# Patient Record
Sex: Female | Born: 1970 | Race: Black or African American | Hispanic: No | Marital: Married | Smoking: Former smoker
Health system: Southern US, Community
[De-identification: ages and names within clinical notes are randomized; demographics above are authoritative.]

## PROBLEM LIST (undated history)

## (undated) DIAGNOSIS — M549 Dorsalgia, unspecified: Secondary | ICD-10-CM

## (undated) DIAGNOSIS — G8929 Other chronic pain: Secondary | ICD-10-CM

## (undated) HISTORY — PX: TOTAL ABDOMINAL HYSTERECTOMY W/ BILATERAL SALPINGOOPHORECTOMY: SHX83

## (undated) HISTORY — PX: ABDOMINAL HYSTERECTOMY: SHX81

---

## 2005-12-08 ENCOUNTER — Emergency Department: Payer: Self-pay | Admitting: Emergency Medicine

## 2006-04-02 ENCOUNTER — Inpatient Hospital Stay: Payer: Self-pay | Admitting: Obstetrics and Gynecology

## 2006-06-20 ENCOUNTER — Ambulatory Visit: Payer: Self-pay | Admitting: Obstetrics and Gynecology

## 2012-03-07 ENCOUNTER — Emergency Department: Payer: Self-pay | Admitting: Emergency Medicine

## 2012-11-21 ENCOUNTER — Ambulatory Visit: Payer: Self-pay | Admitting: Family Medicine

## 2014-04-25 IMAGING — CR CERVICAL SPINE - COMPLETE 4+ VIEW
1 series · 6 of 6 positions shown · non-contrast
Comparison: none

REASON FOR EXAM: neck tension
COMMENTS:

[Series 1: lat · 0.17mm/px · 6 of 6 slices shown]
[im 1/6]
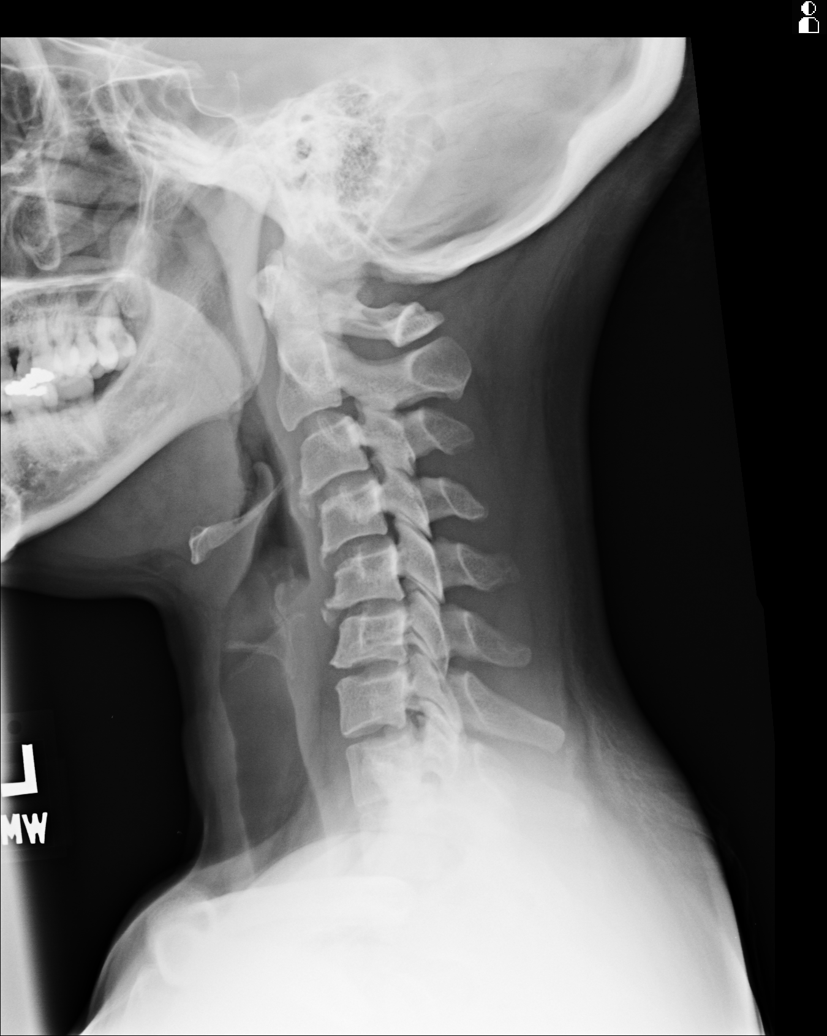
[im 2/6]
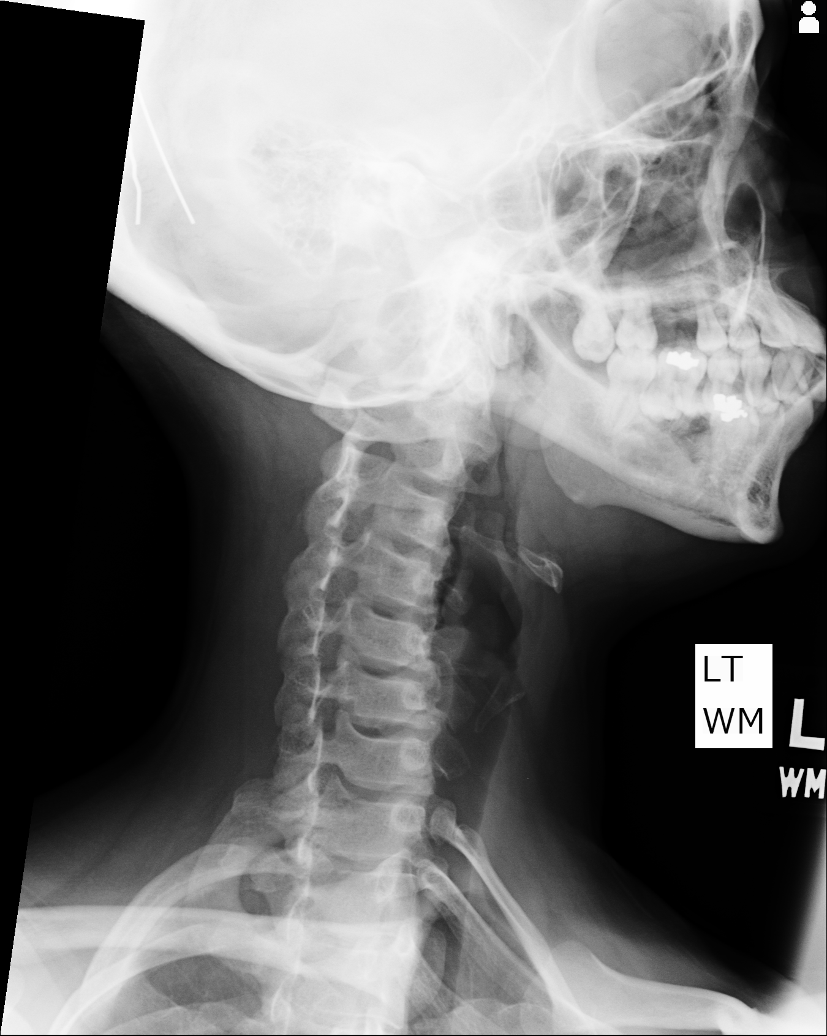
[im 3/6]
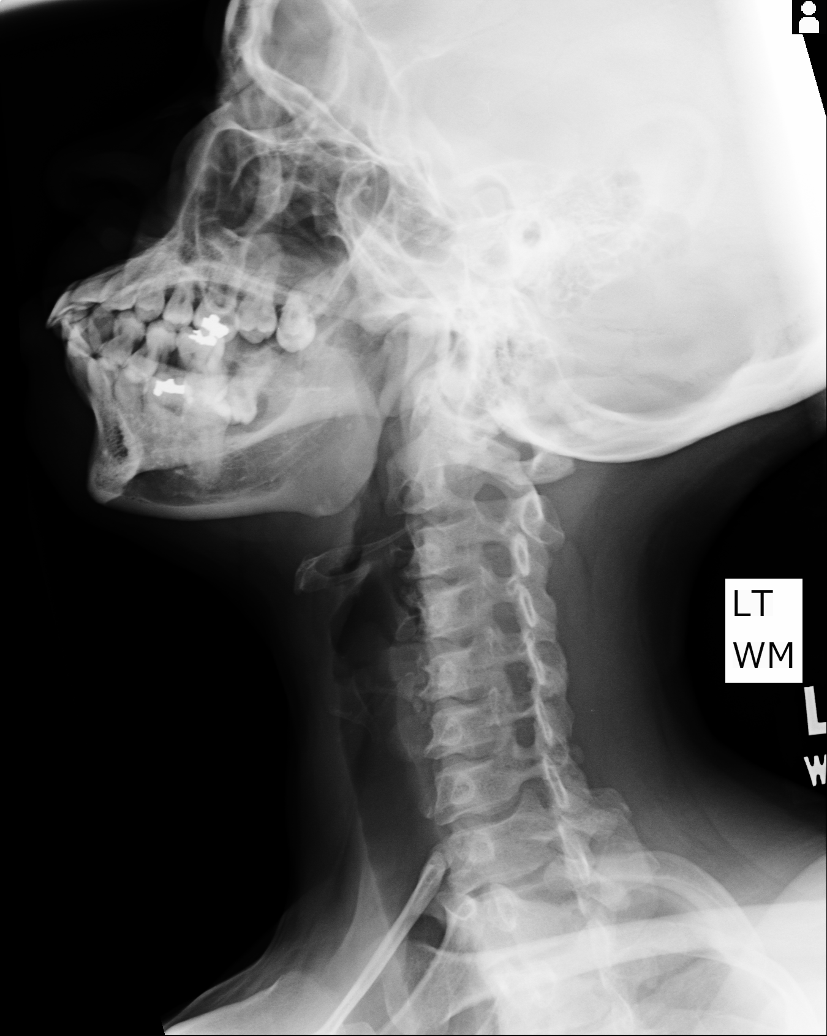
[im 4/6]
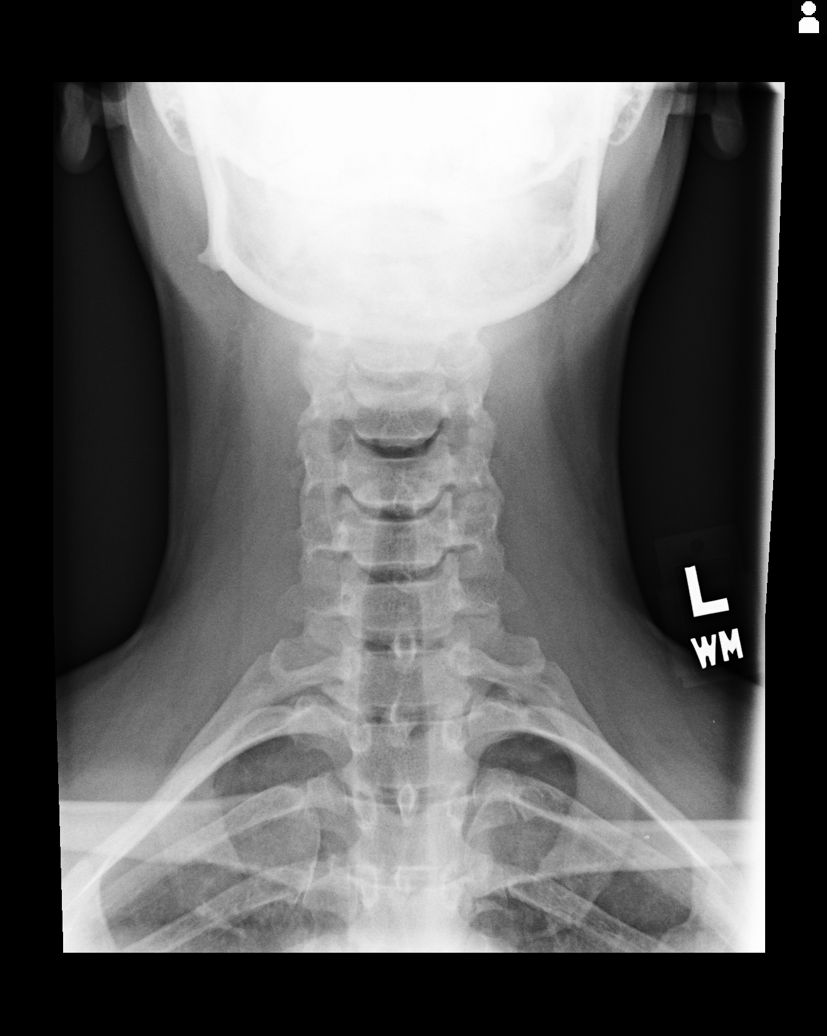
[im 5/6]
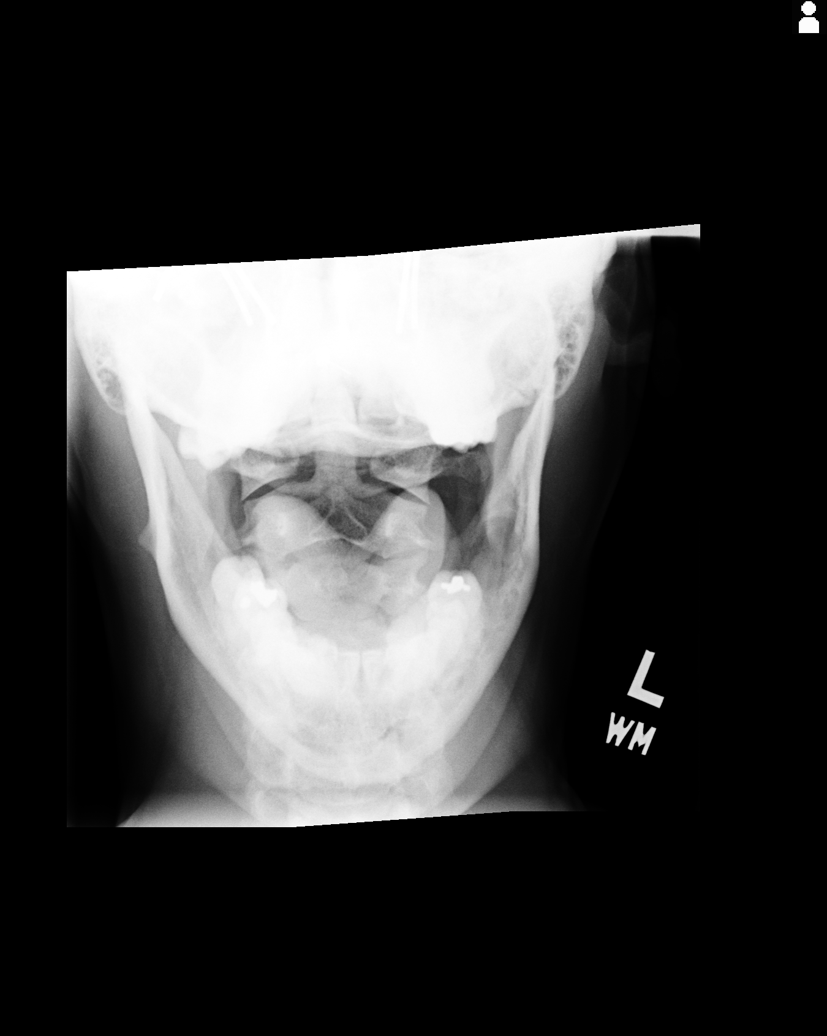
[im 6/6]
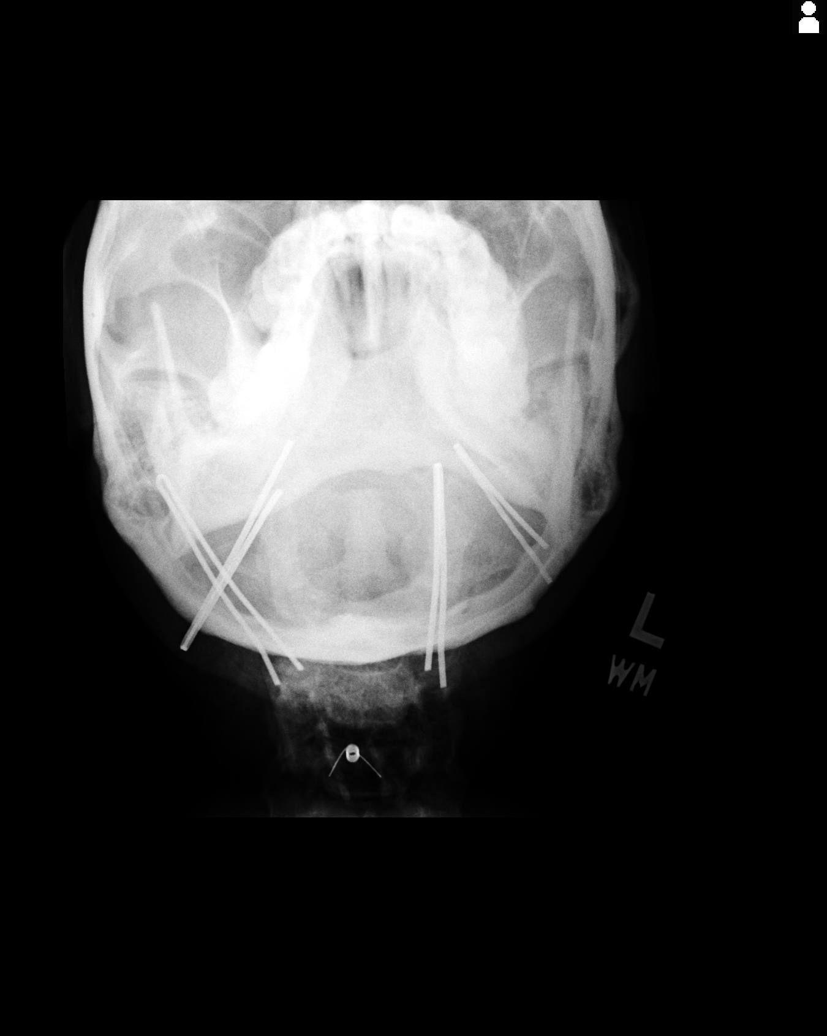

[6 of 6 positions shown; findings below may reference images not displayed]

PROCEDURE:     ABDENAIM - SAVIO LOCKLEAR COMPLETE  - November 21, 2012  [DATE]

RESULT:     There is reversal of the normal cervical lordosis centered at
the C4-C5 level. The prevertebral soft tissues are normal. There is no bony
encroachment on the foramina. The alignment is otherwise unremarkable. The
odontoid appears intact. The atlantoaxial alignment is normal.
IMPRESSION: Reversal of the normal cervical lordosis which may be
secondary to muscle spasm. Correlate clinically. No acute bony abnormality
otherwise.

[REDACTED]

## 2015-11-11 ENCOUNTER — Emergency Department
Admission: EM | Admit: 2015-11-11 | Discharge: 2015-11-11 | Disposition: A | Discharge status: Home / Self Care | Payer: No Typology Code available for payment source | Attending: Emergency Medicine | Admitting: Emergency Medicine

## 2015-11-11 ENCOUNTER — Encounter: Payer: Self-pay | Admitting: Emergency Medicine

## 2015-11-11 DIAGNOSIS — M62830 Muscle spasm of back: Secondary | ICD-10-CM | POA: Diagnosis not present

## 2015-11-11 DIAGNOSIS — S0990XA Unspecified injury of head, initial encounter: Secondary | ICD-10-CM | POA: Insufficient documentation

## 2015-11-11 DIAGNOSIS — Y9389 Activity, other specified: Secondary | ICD-10-CM | POA: Diagnosis not present

## 2015-11-11 DIAGNOSIS — Y998 Other external cause status: Secondary | ICD-10-CM | POA: Insufficient documentation

## 2015-11-11 DIAGNOSIS — M542 Cervicalgia: Secondary | ICD-10-CM

## 2015-11-11 DIAGNOSIS — Z791 Long term (current) use of non-steroidal anti-inflammatories (NSAID): Secondary | ICD-10-CM | POA: Insufficient documentation

## 2015-11-11 DIAGNOSIS — S3992XA Unspecified injury of lower back, initial encounter: Secondary | ICD-10-CM | POA: Insufficient documentation

## 2015-11-11 DIAGNOSIS — S199XXA Unspecified injury of neck, initial encounter: Secondary | ICD-10-CM | POA: Insufficient documentation

## 2015-11-11 DIAGNOSIS — M62838 Other muscle spasm: Secondary | ICD-10-CM

## 2015-11-11 DIAGNOSIS — Y92410 Unspecified street and highway as the place of occurrence of the external cause: Secondary | ICD-10-CM | POA: Diagnosis not present

## 2015-11-11 HISTORY — DX: Other chronic pain: G89.29

## 2015-11-11 HISTORY — DX: Dorsalgia, unspecified: M54.9

## 2015-11-11 MED ORDER — CYCLOBENZAPRINE HCL 10 MG PO TABS: 10.0000 mg | ORAL_TABLET | Freq: Three times a day (TID) | ORAL | Status: DC | PRN

## 2015-11-11 MED ORDER — MELOXICAM 15 MG PO TABS: 15.0000 mg | ORAL_TABLET | Freq: Every day | ORAL | Status: DC
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
## 2016-07-16 ENCOUNTER — Ambulatory Visit (INDEPENDENT_AMBULATORY_CARE_PROVIDER_SITE_OTHER): Payer: PRIVATE HEALTH INSURANCE | Admitting: Family Medicine

## 2016-07-16 ENCOUNTER — Encounter: Payer: Self-pay | Admitting: Family Medicine

## 2016-07-16 VITALS — BP 121/86 | HR 84 | Temp 98.6°F | Ht 67.1 in | Wt 197.4 lb

## 2016-07-16 DIAGNOSIS — Z113 Encounter for screening for infections with a predominantly sexual mode of transmission: Secondary | ICD-10-CM | POA: Diagnosis not present

## 2016-07-16 DIAGNOSIS — J0100 Acute maxillary sinusitis, unspecified: Secondary | ICD-10-CM | POA: Diagnosis not present

## 2016-07-16 MED ORDER — AMOXICILLIN-POT CLAVULANATE 875-125 MG PO TABS: 1.0000 | ORAL_TABLET | Freq: Two times a day (BID) | ORAL | 0 refills | Status: DC

## 2016-07-16 NOTE — Progress Notes (Signed)
BP 121/86 (BP Location: Right Arm, Patient Position: Sitting, Cuff Size: Large)   Pulse 84   Temp 98.6 F (37 C)   Ht 5' 7.1" (1.704 m)   Wt 197 lb 6.4 oz (89.5 kg)   SpO2 100%   BMI 30.83 kg/m    Subjective:    Patient ID: Renee Parsons, female    DOB: 08/10/1971, 45 y.o.   MRN: [id number]  HPI: Renee Parsons is a 45 y.o. female  Chief Complaint  Patient presents with  . Headache   Headaches Duration: 2 weeks Onset: sudden Severity: severe Quality: sharp Frequency: 2-3x a day Location: behind her eyes Headache duration: 4 hours Radiation: no Time of day headache occurs: at random Alleviating factors: aleve- for a couple of hours Aggravating factors: lights and change in the weather Headache status at time of visit: current headache Treatments attempted: Treatments attempted: claritin, sudafed, rest and aleve", excedrine   Aura: no Nausea:  no Vomiting: no Photophobia:  yes Phonophobia:  no Effect on social functioning:  no Confusion:  no Gait disturbance/ataxia:  no Behavioral changes:  no Fevers:  no   UPPER RESPIRATORY TRACT INFECTION Worst symptom: headache Fever: no Cough: no Shortness of breath: no Wheezing: no Chest pain: no Chest tightness: no Chest congestion: no Nasal congestion: yes Runny nose: yes Post nasal drip: no Sneezing: yes Sore throat: no Swollen glands: no Sinus pressure: yes Headache: yes Face pain: yes Toothache: no Ear pain: no  Ear pressure: no  Eyes red/itching:no Eye drainage/crusting: no  Vomiting: no Rash: no Fatigue: yes Sick contacts: no Strep contacts: no  Context: worse Recurrent sinusitis: no Relief with OTC cold/cough medications: no  Treatments attempted: cold/sinus, mucinex, anti-histamine, pseudoephedrine and cough syrup  Relevant past medical, surgical, family and social history reviewed and updated as indicated. Interim medical history since our last visit reviewed. Allergies and medications  reviewed and updated.  Review of Systems  Constitutional: Positive for fatigue. Negative for activity change, appetite change, chills, diaphoresis, fever and unexpected weight change.  HENT: Positive for congestion, rhinorrhea, sinus pain, sinus pressure and sneezing. Negative for dental problem, drooling, ear discharge, ear pain, facial swelling, hearing loss, mouth sores, nosebleeds, postnasal drip, sore throat, tinnitus, trouble swallowing and voice change.   Respiratory: Negative.   Cardiovascular: Negative.   Psychiatric/Behavioral: Negative.     Per HPI unless specifically indicated above     Objective:    BP 121/86 (BP Location: Right Arm, Patient Position: Sitting, Cuff Size: Large)   Pulse 84   Temp 98.6 F (37 C)   Ht 5' 7.1" (1.704 m)   Wt 197 lb 6.4 oz (89.5 kg)   SpO2 100%   BMI 30.83 kg/m   Wt Readings from Last 3 Encounters:  07/16/16 197 lb 6.4 oz (89.5 kg)  11/11/15 46 lb 5 oz (21 kg)    Physical Exam  Constitutional: She is oriented to person, place, and time. She appears well-developed and well-nourished. No distress.  HENT:  Head: Normocephalic and atraumatic.  Right Ear: Hearing, tympanic membrane, external ear and ear canal normal.  Left Ear: Hearing, tympanic membrane, external ear and ear canal normal.  Nose: Mucosal edema and rhinorrhea present. Right sinus exhibits maxillary sinus tenderness. Left sinus exhibits maxillary sinus tenderness.  Mouth/Throat: Uvula is midline, oropharynx is clear and moist and mucous membranes are normal. No oropharyngeal exudate.  Eyes: Conjunctivae, EOM and lids are normal. Pupils are equal, round, and reactive to light. Right eye exhibits no  discharge. Left eye exhibits no discharge. No scleral icterus.  Neck: Normal range of motion. Neck supple. No JVD present. No tracheal deviation present. No thyromegaly present.  Cardiovascular: Normal rate, regular rhythm, normal heart sounds and intact distal pulses.  Exam reveals  no gallop and no friction rub.   No murmur heard. Pulmonary/Chest: Effort normal and breath sounds normal. No stridor. No respiratory distress. She has no wheezes. She has no rales. She exhibits no tenderness.  Musculoskeletal: Normal range of motion.  Lymphadenopathy:    She has cervical adenopathy.  Neurological: She is alert and oriented to person, place, and time.  Skin: Skin is warm, dry and intact. No rash noted. She is not diaphoretic. No erythema. No pallor.  Psychiatric: She has a normal mood and affect. Her speech is normal and behavior is normal. Judgment and thought content normal. Cognition and memory are normal.  Nursing note and vitals reviewed.   No results found for this or any previous visit.    Assessment & Plan:   Problem List Items Addressed This Visit    None    Visit Diagnoses    Acute non-recurrent maxillary sinusitis    -  Primary   Possibly sinusitis, possibly migraine. Given degree of congestion, will treat with augmentin. Call if not getting better or getting worse.    Relevant Medications   loratadine (CLARITIN) 10 MG tablet   amoxicillin-clavulanate (AUGMENTIN) 875-125 MG tablet   Screening for STD (sexually transmitted disease)       Labs drawn today. Await results.    Relevant Orders   GC/Chlamydia Probe Amp   Hepatitis C antibody   HSV(herpes simplex vrs) 1+2 ab-IgG   RPR   HIV antibody       Follow up plan: Return if symptoms worsen or fail to improve.

## 2016-07-17 LAB — HIV ANTIBODY (ROUTINE TESTING W REFLEX): HIV Screen 4th Generation wRfx: NONREACTIVE

## 2016-07-17 LAB — RPR: RPR Ser Ql: NONREACTIVE

## 2016-07-17 LAB — HSV(HERPES SIMPLEX VRS) I + II AB-IGG
HSV 1 Glycoprotein G Ab, IgG: <0.91 index (ref 0.00–0.90)
HSV 2 GLYCOPROTEIN G AB, IGG: 5.16 {index} — AB (ref 0.00–0.90)

## 2016-07-17 LAB — HEPATITIS C ANTIBODY: Hep C Virus Ab: <0.1 s/co ratio (ref 0.0–0.9)

## 2016-07-20 ENCOUNTER — Telehealth: Payer: Self-pay | Admitting: Family Medicine

## 2016-07-20 LAB — GC/CHLAMYDIA PROBE AMP
Chlamydia trachomatis, NAA: NEGATIVE
NEISSERIA GONORRHOEAE BY PCR: NEGATIVE

## 2016-07-20 NOTE — Telephone Encounter (Signed)
Patient notified

## 2016-07-20 NOTE — Telephone Encounter (Signed)
Please let patient know that her labs came back negative, except she has been exposed to genital herpes. This does not mean she will ever have an outbreak, but if she does, let me know and I'll send her in medicine. Thanks!

## 2017-09-02 ENCOUNTER — Ambulatory Visit: Payer: PRIVATE HEALTH INSURANCE | Admitting: Family Medicine

## 2017-09-02 ENCOUNTER — Encounter: Payer: Self-pay | Admitting: Family Medicine

## 2017-09-02 VITALS — BP 126/83 | HR 91 | Temp 98.2°F | Wt 199.5 lb

## 2017-09-02 DIAGNOSIS — J0120 Acute ethmoidal sinusitis, unspecified: Secondary | ICD-10-CM | POA: Diagnosis not present

## 2017-09-02 MED ORDER — AMOXICILLIN-POT CLAVULANATE 875-125 MG PO TABS: 1.0000 | ORAL_TABLET | Freq: Two times a day (BID) | ORAL | 0 refills | Status: DC

## 2017-09-02 NOTE — Progress Notes (Signed)
BP 126/83 (BP Location: Right Arm, Patient Position: Sitting, Cuff Size: Normal)   Pulse 91   Temp 98.2 F (36.8 C) (Oral)   Wt 199 lb 8 oz (90.5 kg)   SpO2 99%   BMI 31.15 kg/m    Subjective:    Patient ID: Renee Parsons, female    DOB: [birth year], 47 y.o.   MRN: [id number]  HPI: Renee Parsons is a 47 y.o. female  Chief Complaint  Patient presents with  . Sinusitis    x's 6 days. Sinus Pressure. Bloody/Green in color. No other complaints.   About a week of facial pain and pressure, bloody green discharge, generalized malaise, HAs, cough. Denies fever, chills, CP, SOB. Trying flonase, sudafed, claritin with no relief. Does have a history of sinusitis 1-2 times annually.   Relevant past medical, surgical, family and social history reviewed and updated as indicated. Interim medical history since our last visit reviewed. Allergies and medications reviewed and updated.  Review of Systems  Constitutional: Positive for fatigue.  HENT: Positive for congestion, sinus pressure and sinus pain.   Respiratory: Positive for cough.   Cardiovascular: Negative.   Gastrointestinal: Negative.   Genitourinary: Negative.   Musculoskeletal: Negative.   Neurological: Positive for headaches.  Psychiatric/Behavioral: Negative.    Per HPI unless specifically indicated above     Objective:    BP 126/83 (BP Location: Right Arm, Patient Position: Sitting, Cuff Size: Normal)   Pulse 91   Temp 98.2 F (36.8 C) (Oral)   Wt 199 lb 8 oz (90.5 kg)   SpO2 99%   BMI 31.15 kg/m   Wt Readings from Last 3 Encounters:  09/02/17 199 lb 8 oz (90.5 kg)  07/16/16 197 lb 6.4 oz (89.5 kg)  11/11/15 46 lb 5 oz (21 kg)    Physical Exam  Constitutional: She is oriented to person, place, and time. She appears well-developed and well-nourished. No distress.  HENT:  Head: Atraumatic.  Ethmoidal sinuses ttp b/l Right TM injected Oropharynx and nasal mucosa erythematous  Eyes: Conjunctivae are normal.  Pupils are equal, round, and reactive to light. No scleral icterus.  Cardiovascular: Normal rate and normal heart sounds.  Pulmonary/Chest: Effort normal and breath sounds normal. No respiratory distress.  Musculoskeletal: Normal range of motion.  Neurological: She is alert and oriented to person, place, and time.  Skin: Skin is warm and dry.  Psychiatric: She has a normal mood and affect. Her behavior is normal.  Nursing note and vitals reviewed.  Results for orders placed or performed in visit on 07/16/16  GC/Chlamydia Probe Amp  Result Value Ref Range   Chlamydia trachomatis, NAA Negative Negative   Neisseria gonorrhoeae by PCR Negative Negative  Hepatitis C antibody  Result Value Ref Range   Hep C Virus Ab <0.1 0.0 - 0.9 s/co ratio  HSV(herpes simplex vrs) 1+2 ab-IgG  Result Value Ref Range   HSV 1 Glycoprotein G Ab, IgG <0.91 0.00 - 0.90 index   HSV 2 Glycoprotein G Ab, IgG 5.16 (H) 0.00 - 0.90 index  RPR  Result Value Ref Range   RPR Ser Ql Non Reactive Non Reactive  HIV antibody  Result Value Ref Range   HIV Screen 4th Generation wRfx Non Reactive Non Reactive      Assessment & Plan:   Problem List Items Addressed This Visit    None    Visit Diagnoses    Acute ethmoidal sinusitis, recurrence not specified    -  Primary  Will tx with augmentin. Reviewed mucinex, sinus rinses, humidifier. F/u if no improvement   Relevant Medications   amoxicillin-clavulanate (AUGMENTIN) 875-125 MG tablet       Follow up plan: Return if symptoms worsen or fail to improve.

## 2017-09-05 NOTE — Patient Instructions (Signed)
Follow up as needed

## 2017-09-07 ENCOUNTER — Ambulatory Visit: Payer: Self-pay

## 2017-09-07 MED ORDER — PREDNISONE 10 MG PO TABS
ORAL_TABLET | ORAL | 0 refills | Status: DC
Start: 2017-09-07 — End: 2018-01-24

## 2017-09-07 NOTE — Telephone Encounter (Signed)
  Reason for Disposition . Earache  (Exceptions: brief ear pain of < 60 minutes duration, earache occurring during air travel  Answer Assessment - Initial Assessment Questions 1. LOCATION: "Which ear is involved?"     Right ear 2. ONSET: "When did the ear start hurting"      Started Tuesday 3. SEVERITY: "How bad is the pain?"  (Scale 1-10; mild, moderate or severe)   - MILD (1-3): doesn't interfere with normal activities    - MODERATE (4-7): interferes with normal activities or awakens from sleep    - SEVERE (8-10): excruciating pain, unable to do any normal activities      7 4. URI SYMPTOMS: " Do you have a runny nose or cough?"     Slight runny nose 5. FEVER: "Do you have a fever?" If so, ask: "What is your temperature, how was it measured, and when did it start?"     No 6. CAUSE: "Have you been swimming recently?", "How often do you use Q-TIPS?", "Have you had any recent air travel or scuba diving?"     No 7. OTHER SYMPTOMS: "Do you have any other symptoms?" (e.g., headache, stiff neck, dizziness, vomiting, runny nose, decreased hearing)     Right eye is swollen and eye socket hurts 8. PREGNANCY: "Is there any chance you are pregnant?" "When was your last menstrual period?"     No  Protocols used: EARACHE-A-AH

## 2017-09-07 NOTE — Telephone Encounter (Signed)
Routing to provider  

## 2017-09-07 NOTE — Telephone Encounter (Signed)
I will send over some prednisone to help ease her sxs, she should continue full course of abx as directed and can do flonase twice daily and sinus rinses

## 2017-09-07 NOTE — Telephone Encounter (Signed)
Patient notified

## 2017-09-16 ENCOUNTER — Ambulatory Visit: Payer: PRIVATE HEALTH INSURANCE | Admitting: Unknown Physician Specialty

## 2017-09-16 ENCOUNTER — Encounter: Payer: Self-pay | Admitting: Unknown Physician Specialty

## 2017-09-16 DIAGNOSIS — S0300XA Dislocation of jaw, unspecified side, initial encounter: Secondary | ICD-10-CM | POA: Insufficient documentation

## 2017-09-16 MED ORDER — NAPROXEN 500 MG PO TABS: 500.0000 mg | ORAL_TABLET | Freq: Two times a day (BID) | ORAL | 0 refills | Status: DC

## 2017-09-16 NOTE — Progress Notes (Signed)
BP 118/88   Pulse 79   Temp 98.1 F (36.7 C) (Oral)   Wt 198 lb 9.6 oz (90.1 kg)   SpO2 99%   BMI 31.01 kg/m    Subjective:    Patient ID: Renee Parsons Lurz, female    DOB: 05/20/1971, 47 y.o.   MRN: [id number]  HPI: Renee Parsons Goldwasser is a 47 y.o. female  Chief Complaint  Patient presents with  . Ear Pain    pt states her right ear has had a stabbing pain since 09/02/17   Otalgia   There is pain in the right ear. Chronicity: persistent from the 10th following a sinus infection in which the ear looked irritated. Episode frequency: comes and goes, worse in the AM with stabbing pain followed by headache. The problem has been unchanged. There has been no fever. Associated symptoms include headaches. Pertinent negatives include no abdominal pain, coughing, diarrhea, ear discharge, hearing loss, neck pain, rash, rhinorrhea, sore throat or vomiting. She has tried NSAIDs for the symptoms. The treatment provided mild (Relief for just a few hours) relief. There is no history of a chronic ear infection, hearing loss or a tympanostomy tube.    Relevant past medical, surgical, family and social history reviewed and updated as indicated. Interim medical history since our last visit reviewed. Allergies and medications reviewed and updated.  Review of Systems  HENT: Positive for ear pain. Negative for ear discharge, hearing loss, rhinorrhea and sore throat.   Respiratory: Negative for cough.   Gastrointestinal: Negative for abdominal pain, diarrhea and vomiting.  Musculoskeletal: Negative for neck pain.  Skin: Negative for rash.  Neurological: Positive for headaches.    Per HPI unless specifically indicated above     Objective:    BP 118/88   Pulse 79   Temp 98.1 F (36.7 C) (Oral)   Wt 198 lb 9.6 oz (90.1 kg)   SpO2 99%   BMI 31.01 kg/m   Wt Readings from Last 3 Encounters:  09/16/17 198 lb 9.6 oz (90.1 kg)  09/02/17 199 lb 8 oz (90.5 kg)  07/16/16 197 lb 6.4 oz (89.5 kg)      Physical Exam  Constitutional: She is oriented to person, place, and time. She appears well-developed and well-nourished. No distress.  HENT:  Head: Normocephalic and atraumatic.  Right Ear: External ear normal.  Left Ear: External ear normal.  Right TMJ TTP.  Has a mouthguard she is not wearing  Eyes: Conjunctivae and lids are normal. Right eye exhibits no discharge. Left eye exhibits no discharge. No scleral icterus.  Neck: Normal range of motion. Neck supple. No JVD present. Carotid bruit is not present.  Pulmonary/Chest: Effort normal.  Abdominal: Normal appearance. There is no splenomegaly or hepatomegaly.  Musculoskeletal: Normal range of motion.  Neurological: She is alert and oriented to person, place, and time.  Skin: Skin is warm, dry and intact. No rash noted. No pallor.  Psychiatric: She has a normal mood and affect. Her behavior is normal. Judgment and thought content normal.    Results for orders placed or performed in visit on 07/16/16  GC/Chlamydia Probe Amp  Result Value Ref Range   Chlamydia trachomatis, NAA Negative Negative   Neisseria gonorrhoeae by PCR Negative Negative  Hepatitis C antibody  Result Value Ref Range   Hep C Virus Ab <0.1 0.0 - 0.9 s/co ratio  HSV(herpes simplex vrs) 1+2 ab-IgG  Result Value Ref Range   HSV 1 Glycoprotein G Ab, IgG <0.91 0.00 - 0.90  index   HSV 2 Glycoprotein G Ab, IgG 5.16 (H) 0.00 - 0.90 index  RPR  Result Value Ref Range   RPR Ser Ql Non Reactive Non Reactive  HIV antibody  Result Value Ref Range   HIV Screen 4th Generation wRfx Non Reactive Non Reactive      Assessment & Plan:   Problem List Items Addressed This Visit      Unprioritized   TMJ (dislocation of temporomandibular joint)    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  Discussed using mouth guard.  Prescription NSAIDS          Follow up plan: Return if symptoms worsen or fail to improve.

## 2017-09-16 NOTE — Assessment & Plan Note (Signed)
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  Discussed using mouth guard.  Prescription NSAIDS

## 2018-01-24 ENCOUNTER — Ambulatory Visit: Payer: PRIVATE HEALTH INSURANCE | Admitting: Family Medicine

## 2018-01-24 ENCOUNTER — Encounter: Payer: Self-pay | Admitting: Family Medicine

## 2018-01-24 VITALS — BP 122/83 | HR 89 | Temp 97.6°F | Wt 202.6 lb

## 2018-01-24 DIAGNOSIS — R519 Headache, unspecified: Secondary | ICD-10-CM

## 2018-01-24 DIAGNOSIS — Z13220 Encounter for screening for lipoid disorders: Secondary | ICD-10-CM

## 2018-01-24 DIAGNOSIS — Z720 Tobacco use: Secondary | ICD-10-CM | POA: Diagnosis not present

## 2018-01-24 DIAGNOSIS — R42 Dizziness and giddiness: Secondary | ICD-10-CM

## 2018-01-24 DIAGNOSIS — N390 Urinary tract infection, site not specified: Secondary | ICD-10-CM | POA: Diagnosis not present

## 2018-01-24 DIAGNOSIS — Z87891 Personal history of nicotine dependence: Secondary | ICD-10-CM | POA: Insufficient documentation

## 2018-01-24 DIAGNOSIS — R51 Headache: Secondary | ICD-10-CM | POA: Diagnosis not present

## 2018-01-24 DIAGNOSIS — R0683 Snoring: Secondary | ICD-10-CM

## 2018-01-24 DIAGNOSIS — R8281 Pyuria: Secondary | ICD-10-CM

## 2018-01-24 LAB — MICROSCOPIC EXAMINATION: RBC, UA: NONE SEEN /hpf (ref 0–2)

## 2018-01-24 LAB — UA/M W/RFLX CULTURE, ROUTINE
BILIRUBIN UA: NEGATIVE
GLUCOSE, UA: NEGATIVE
KETONES UA: NEGATIVE
Nitrite, UA: NEGATIVE
PROTEIN UA: NEGATIVE
RBC UA: NEGATIVE
SPEC GRAV UA: 1.02 (ref 1.005–1.030)
UUROB: 1 mg/dL (ref 0.2–1.0)
pH, UA: 5.5 (ref 5.0–7.5)

## 2018-01-24 LAB — BAYER DCA HB A1C WAIVED: HB A1C (BAYER DCA - WAIVED): 5.1 % (ref <7.0)

## 2018-01-24 MED ORDER — CYCLOBENZAPRINE HCL 10 MG PO TABS: 10.0000 mg | ORAL_TABLET | Freq: Every day | ORAL | 0 refills | Status: DC

## 2018-01-24 MED ORDER — BUPROPION HCL ER (SR) 150 MG PO TB12: ORAL_TABLET | ORAL | 3 refills | Status: DC

## 2018-01-24 NOTE — Patient Instructions (Signed)
Screening for Sleep Apnea Sleep apnea is a condition in which breathing pauses or becomes shallow during sleep. Sleep apnea screening is a test to determine if you are at risk for sleep apnea. The test is easy and only takes a few minutes. Your health care provider may ask you to have this test in preparation for surgery or as part of a physical exam. What are the symptoms of sleep apnea? Common symptoms of sleep apnea include:  Snoring.  Restless sleep.  Daytime sleepiness.  Pauses in breathing.  Choking during sleep.  Irritability.  Forgetfulness.  Trouble thinking clearly.  Depression.  Personality changes.  Most people with sleep apnea are not aware that they have it. Why should I get screened? Getting screened for sleep apnea can help:  Ensure your safety. It is important for your health care providers to know whether or not you have sleep apnea, especially if you are having surgery or have other long-term (chronic) health conditions.  Improve your health and allow you to get a better night's rest. Restful sleep can help you: ? Have more energy. ? Lose weight. ? Improve high blood pressure. ? Improve diabetes management. ? Prevent stroke. ? Prevent car accidents.  How is screening done? Screening usually includes being asked a list of questions about your sleep quality. Some questions you may be asked include:  Do you snore?  Is your sleep restless?  Do you have daytime sleepiness?  Has a partner or spouse told you that you stop breathing during sleep?  Have you had trouble concentrating or memory loss?  If your screening test is positive, you are at risk for the condition. Further testing may be needed to confirm a diagnosis of sleep apnea. Where to find more information: You can find screening tools online or at your health care clinic. For more information about sleep apnea screening and healthy sleep, visit these websites:  Centers for Disease Control  and Prevention: DetailSports.iswww.cdc.gov/sleep/index.html  American Sleep Apnea Association: www.sleepapnea.org  Contact a health care provider if:  You think that you may have sleep apnea. Summary  Sleep apnea screening can help determine if you are at risk for sleep apnea.  It is important for your health care providers to know whether or not you have sleep apnea, especially if you are having surgery or have other chronic health conditions.  You may be asked to take a screening test for sleep apnea in preparation for surgery or as part of a physical exam. This information is not intended to replace advice given to you by your health care provider. Make sure you discuss any questions you have with your health care provider. Document Released: 11/19/2016 Document Revised: 11/19/2016 Document Reviewed: 11/19/2016 Elsevier Interactive Patient Education  2018 [employer]  Screening for Sleep Apnea Sleep apnea is a condition in which breathing pauses or becomes shallow during sleep. Sleep apnea screening is a test to determine if you are at risk for sleep apnea. The test is easy and only takes a few minutes. Your health care provider may ask you to have this test in preparation for surgery or as part of a physical exam. What are the symptoms of sleep apnea? Common symptoms of sleep apnea include:  Snoring.  Restless sleep.  Daytime sleepiness.  Pauses in breathing.  Choking during sleep.  Irritability.  Forgetfulness.  Trouble thinking clearly.  Depression.  Personality changes.  Most people with sleep apnea are not aware that they have it. Why should I get  screened? Getting screened for sleep apnea can help:  Ensure your safety. It is important for your health care providers to know whether or not you have sleep apnea, especially if you are having surgery or have other long-term (chronic) health conditions.  Improve your health and allow you to get a better night's rest. Restful  sleep can help you: ? Have more energy. ? Lose weight. ? Improve high blood pressure. ? Improve diabetes management. ? Prevent stroke. ? Prevent car accidents.  How is screening done? Screening usually includes being asked a list of questions about your sleep quality. Some questions you may be asked include:  Do you snore?  Is your sleep restless?  Do you have daytime sleepiness?  Has a partner or spouse told you that you stop breathing during sleep?  Have you had trouble concentrating or memory loss?  If your screening test is positive, you are at risk for the condition. Further testing may be needed to confirm a diagnosis of sleep apnea. Where to find more information: You can find screening tools online or at your health care clinic. For more information about sleep apnea screening and healthy sleep, visit these websites:  Centers for Disease Control and Prevention: DetailSports.is  American Sleep Apnea Association: www.sleepapnea.org  Contact a health care provider if:  You think that you may have sleep apnea. Summary  Sleep apnea screening can help determine if you are at risk for sleep apnea.  It is important for your health care providers to know whether or not you have sleep apnea, especially if you are having surgery or have other chronic health conditions.  You may be asked to take a screening test for sleep apnea in preparation for surgery or as part of a physical exam. This information is not intended to replace advice given to you by your health care provider. Make sure you discuss any questions you have with your health care provider. Document Released: 11/19/2016 Document Revised: 11/19/2016 Document Reviewed: 11/19/2016 Elsevier Interactive Patient Education  2018 [employer].

## 2018-01-24 NOTE — Assessment & Plan Note (Signed)
Will start her on wellbutrin. Recheck 1 month. Call with any concerns.

## 2018-01-24 NOTE — Addendum Note (Signed)
Addended by: Dorcas CarrowJOHNSON, Zuleyma Scharf P on: 01/24/2018 04:16 PM   Modules accepted: Orders

## 2018-01-24 NOTE — Progress Notes (Signed)
BP 122/83 (BP Location: Left Arm, Patient Position: Sitting, Cuff Size: Normal)   Pulse 89   Temp 97.6 F (36.4 C)   Wt 202 lb 9 oz (91.9 kg)   SpO2 100%   BMI 31.63 kg/m    Subjective:    Patient ID: Renee Parsons, female    DOB: [birth year], 47 y.o.   MRN: [id number]  HPI: Renee Parsons is a 47 y.o. female  Chief Complaint  Patient presents with  . Dizziness  . Migraine  . Nicotine Dependence   Wakes up with headaches for about the past month. She notes that the headaches are sometimes stabbing in nature. Sometimes aching and tight. 6/7 mornings. Headaches are located in the back of her head or in her temple. No radiation. Does have pain in her neck. Has been taking aleve 10-12 x a day. 3-4x a week has a headache all day. No changes in her vision. No neurologic symptoms. +Snoring. Not tired when she wakes up. Feels like she is having extra energy. No hypersomnia. Had a sleep study more than 3 years ago. Was borderline for sleep apnea, snoring has gotten worse.   DIZZINESS Duration: 2 weeks ago Description of symptoms: off kilter Duration of episode: 2 days Dizziness frequency: 1x 2 weeks ago Provoking factors: none Aggravating factors:  none Triggered by rolling over in bed: no Triggered by bending over: no Aggravated by head movement: no Aggravated by exertion, coughing, loud noises: no Recent head injury: no Recent or current viral symptoms: no History of vasovagal episodes: no Nausea: yes Vomiting: yes Tinnitus: no Hearing loss: no Aural fullness: yes Headache: yes Photophobia/phonophobia: no Unsteady gait: no Postural instability: no Diplopia, dysarthria, dysphagia or weakness: no Related to exertion: no Pallor: no Diaphoresis: no Dyspnea: no Chest pain: no  SMOKING CESSATION Smoking Status: current every day smoker Smoking Amount: 4 cigs a day Smoking Onset: in her 10s Smoking Quit Date: not set Smoking triggers: stress at work Type of tobacco  use: cigarettes Children in the house: no Other household members who smoke: yes Treatments attempted: wellbutrin Pneumovax: Refused   Relevant past medical, surgical, family and social history reviewed and updated as indicated. Interim medical history since our last visit reviewed. Allergies and medications reviewed and updated.  Review of Systems  Constitutional: Negative.   Respiratory: Negative.   Cardiovascular: Negative.   Musculoskeletal: Negative.   Neurological: Positive for dizziness and headaches. Negative for tremors, seizures, syncope, facial asymmetry, speech difficulty, weakness, light-headedness and numbness.  Hematological: Negative.   Psychiatric/Behavioral: Negative.     Per HPI unless specifically indicated above     Objective:    BP 122/83 (BP Location: Left Arm, Patient Position: Sitting, Cuff Size: Normal)   Pulse 89   Temp 97.6 F (36.4 C)   Wt 202 lb 9 oz (91.9 kg)   SpO2 100%   BMI 31.63 kg/m   Wt Readings from Last 3 Encounters:  01/24/18 202 lb 9 oz (91.9 kg)  09/16/17 198 lb 9.6 oz (90.1 kg)  09/02/17 199 lb 8 oz (90.5 kg)    Physical Exam  Constitutional: She is oriented to person, place, and time. She appears well-developed and well-nourished. No distress.  HENT:  Head: Normocephalic and atraumatic.  Right Ear: Hearing and external ear normal.  Left Ear: Hearing and external ear normal.  Nose: Nose normal.  Mouth/Throat: Oropharynx is clear and moist. No oropharyngeal exudate.  Eyes: Pupils are equal, round, and reactive to light. Conjunctivae, EOM and  lids are normal. Right eye exhibits no discharge. Left eye exhibits no discharge. No scleral icterus. Right eye exhibits normal extraocular motion and no nystagmus. Left eye exhibits normal extraocular motion and no nystagmus.  Neck: No JVD present. No tracheal deviation present. No thyromegaly present.  Cardiovascular: Normal rate, regular rhythm, normal heart sounds and intact distal  pulses. Exam reveals no gallop and no friction rub.  No murmur heard. Pulmonary/Chest: Effort normal and breath sounds normal. No stridor. No respiratory distress. She has no wheezes. She has no rales. She exhibits no tenderness.  Musculoskeletal: Normal range of motion.  Trap spasm on the R  Lymphadenopathy:    She has no cervical adenopathy.  Neurological: She is alert and oriented to person, place, and time.  Skin: Skin is warm, dry and intact. Capillary refill takes less than 2 seconds. No rash noted. She is not diaphoretic. No erythema. No pallor.  Psychiatric: She has a normal mood and affect. Her speech is normal and behavior is normal. Judgment and thought content normal. Cognition and memory are normal.  Nursing note and vitals reviewed.   Results for orders placed or performed in visit on 07/16/16  GC/Chlamydia Probe Amp  Result Value Ref Range   Chlamydia trachomatis, NAA Negative Negative   Neisseria gonorrhoeae by PCR Negative Negative  Hepatitis C antibody  Result Value Ref Range   Hep C Virus Ab <0.1 0.0 - 0.9 s/co ratio  HSV(herpes simplex vrs) 1+2 ab-IgG  Result Value Ref Range   HSV 1 Glycoprotein G Ab, IgG <0.91 0.00 - 0.90 index   HSV 2 Glycoprotein G Ab, IgG 5.16 (H) 0.00 - 0.90 index  RPR  Result Value Ref Range   RPR Ser Ql Non Reactive Non Reactive  HIV antibody  Result Value Ref Range   HIV Screen 4th Generation wRfx Non Reactive Non Reactive      Assessment & Plan:   Problem List Items Addressed This Visit      Other   Tobacco abuse    Will start her on wellbutrin. Recheck 1 month. Call with any concerns.        Other Visit Diagnoses    Dizziness    -  Primary   Resolved. Will check labs. Await results.    Relevant Orders   Bayer DCA Hb A1c Waived   CBC with Differential/Platelet   Comprehensive metabolic panel   Thyroid Panel With TSH   VITAMIN D 25 Hydroxy (Vit-D Deficiency, Fractures)   UA/M w/rflx Culture, Routine   Screening for  cholesterol level       Labs drawn today. Await results.    Relevant Orders   Lipid Panel w/o Chol/HDL Ratio   Morning headache       Will treat for trap spasm and will also get her in for sleep study. Avoid aleve to avoid rebound.   Relevant Medications   buPROPion (WELLBUTRIN SR) 150 MG 12 hr tablet   cyclobenzaprine (FLEXERIL) 10 MG tablet   Other Relevant Orders   Ambulatory referral to Sleep Studies   Snoring       Borderline for OSA last sleep study. Will get her back in for new one.    Relevant Orders   Ambulatory referral to Sleep Studies       Follow up plan: Return in about 1 month (around 02/21/2018) for Physical and follow up.

## 2018-01-25 LAB — CBC WITH DIFFERENTIAL/PLATELET
BASOS: 0 %
Basophils Absolute: 0 10*3/uL (ref 0.0–0.2)
EOS (ABSOLUTE): 0.2 10*3/uL (ref 0.0–0.4)
Eos: 2 %
HEMATOCRIT: 37.7 % (ref 34.0–46.6)
Hemoglobin: 12.9 g/dL (ref 11.1–15.9)
IMMATURE GRANULOCYTES: 0 %
Immature Grans (Abs): 0 10*3/uL (ref 0.0–0.1)
LYMPHS ABS: 3.2 10*3/uL — AB (ref 0.7–3.1)
Lymphs: 29 %
MCH: 29.1 pg (ref 26.6–33.0)
MCHC: 34.2 g/dL (ref 31.5–35.7)
MCV: 85 fL (ref 79–97)
MONOS ABS: 0.8 10*3/uL (ref 0.1–0.9)
Monocytes: 8 %
NEUTROS PCT: 61 %
Neutrophils Absolute: 6.7 10*3/uL (ref 1.4–7.0)
PLATELETS: 426 10*3/uL (ref 150–450)
RBC: 4.43 x10E6/uL (ref 3.77–5.28)
RDW: 13.8 % (ref 12.3–15.4)
WBC: 10.9 10*3/uL — AB (ref 3.4–10.8)

## 2018-01-25 LAB — COMPREHENSIVE METABOLIC PANEL
A/G RATIO: 1 — AB (ref 1.2–2.2)
ALK PHOS: 173 IU/L — AB (ref 39–117)
ALT: 58 IU/L — AB (ref 0–32)
AST: 45 IU/L — AB (ref 0–40)
Albumin: 4.2 g/dL (ref 3.5–5.5)
BILIRUBIN TOTAL: 0.3 mg/dL (ref 0.0–1.2)
BUN/Creatinine Ratio: 8 — ABNORMAL LOW (ref 9–23)
BUN: 9 mg/dL (ref 6–24)
CHLORIDE: 101 mmol/L (ref 96–106)
CO2: 23 mmol/L (ref 20–29)
Calcium: 9.2 mg/dL (ref 8.7–10.2)
Creatinine, Ser: 1.07 mg/dL — ABNORMAL HIGH (ref 0.57–1.00)
GFR calc Af Amer: 71 mL/min/{1.73_m2} (ref >59)
GFR calc non Af Amer: 62 mL/min/{1.73_m2} (ref >59)
GLOBULIN, TOTAL: 4.1 g/dL (ref 1.5–4.5)
Glucose: 77 mg/dL (ref 65–99)
POTASSIUM: 3.5 mmol/L (ref 3.5–5.2)
SODIUM: 138 mmol/L (ref 134–144)
Total Protein: 8.3 g/dL (ref 6.0–8.5)

## 2018-01-25 LAB — LIPID PANEL W/O CHOL/HDL RATIO
CHOLESTEROL TOTAL: 207 mg/dL — AB (ref 100–199)
HDL: 62 mg/dL (ref >39)
LDL Calculated: 115 mg/dL — ABNORMAL HIGH (ref 0–99)
TRIGLYCERIDES: 148 mg/dL (ref 0–149)
VLDL Cholesterol Cal: 30 mg/dL (ref 5–40)

## 2018-01-25 LAB — THYROID PANEL WITH TSH
FREE THYROXINE INDEX: 1.4 (ref 1.2–4.9)
T3 Uptake Ratio: 20 % — ABNORMAL LOW (ref 24–39)
T4, Total: 7 ug/dL (ref 4.5–12.0)
TSH: 3.66 u[IU]/mL (ref 0.450–4.500)

## 2018-01-25 LAB — VITAMIN D 25 HYDROXY (VIT D DEFICIENCY, FRACTURES): VIT D 25 HYDROXY: 12.6 ng/mL — AB (ref 30.0–100.0)

## 2018-01-26 ENCOUNTER — Telehealth: Payer: Self-pay | Admitting: Family Medicine

## 2018-01-26 LAB — URINE CULTURE

## 2018-01-26 MED ORDER — VITAMIN D (ERGOCALCIFEROL) 1.25 MG (50000 UNIT) PO CAPS: 50000.0000 [IU] | ORAL_CAPSULE | ORAL | 0 refills | Status: DC

## 2018-01-26 NOTE — Telephone Encounter (Signed)
Please let Renee Parsons know that her labs look good except her vitamin D is very low. I've sent a Rx to her pharmacy and we'll see how it does. Thanks!

## 2018-01-26 NOTE — Telephone Encounter (Signed)
Patient notified or results and medication.

## 2018-05-26 ENCOUNTER — Encounter: Payer: Self-pay | Admitting: Family Medicine

## 2018-05-26 ENCOUNTER — Ambulatory Visit: Payer: PRIVATE HEALTH INSURANCE | Admitting: Family Medicine

## 2018-05-26 VITALS — BP 128/90 | HR 121 | Temp 98.2°F | Ht 67.0 in | Wt 199.0 lb

## 2018-05-26 DIAGNOSIS — S0300XA Dislocation of jaw, unspecified side, initial encounter: Secondary | ICD-10-CM

## 2018-05-26 DIAGNOSIS — J0100 Acute maxillary sinusitis, unspecified: Secondary | ICD-10-CM

## 2018-05-26 MED ORDER — AMOXICILLIN-POT CLAVULANATE 875-125 MG PO TABS: 1.0000 | ORAL_TABLET | Freq: Two times a day (BID) | ORAL | 0 refills | Status: DC

## 2018-05-26 MED ORDER — PREDNISONE 50 MG PO TABS: 50.0000 mg | ORAL_TABLET | Freq: Every day | ORAL | 0 refills | Status: DC

## 2018-05-26 MED ORDER — TIZANIDINE HCL 4 MG PO TABS: 4.0000 mg | ORAL_TABLET | Freq: Four times a day (QID) | ORAL | 0 refills | Status: DC | PRN

## 2018-05-26 NOTE — Assessment & Plan Note (Signed)
No better. Will treat with prednisone and tizanidine. Rx for mouth guard given today. If not better in 2 weeks. Will get her into PT.

## 2018-05-26 NOTE — Progress Notes (Signed)
BP 128/90   Pulse (!) 121   Temp 98.2 F (36.8 C) (Oral)   Ht 5\' 7"  (1.702 m)   Wt 199 lb (90.3 kg)   SpO2 98%   BMI 31.17 kg/m    Subjective:    Patient ID: Renee Parsons, female    DOB: 14-Nov-1970, 47 y.o.   MRN: [id number]  HPI: Renee Parsons is a 47 y.o. female  Chief Complaint  Patient presents with  . Headache    pt states ongoing for a while and states that she might need a mouth guard  . Sinusitis  . Facial Swelling    right side   Headache that has been going on for months. Has been having pain in her ear and goes down into her neck. Mainly right around her ear. Muscle relaxer didn't help at all. Talked to her dentist. Dentist is going to get her the mouth guard.   UPPER RESPIRATORY TRACT INFECTION Duration: Was sick 2 weeks, got a little better then came back Worst symptom: sore throat and congestion Fever: no Cough: no Shortness of breath: no Wheezing: no Chest pain: no Chest tightness: no Chest congestion: no Nasal congestion: yes Runny nose: yes Post nasal drip: yes Sneezing: yes Sore throat: yes Swollen glands: yes Sinus pressure: no Headache: yes Face pain: yes Toothache: yes Ear pain: yes "right Ear pressure: no  Eyes red/itching:no Eye drainage/crusting: no  Vomiting: no Rash: no Fatigue: yes Sick contacts: no Strep contacts: no  Context: worse Recurrent sinusitis: no Relief with OTC cold/cough medications: no  Treatments attempted: anti-histamine    Relevant past medical, surgical, family and social history reviewed and updated as indicated. Interim medical history since our last visit reviewed. Allergies and medications reviewed and updated.  Review of Systems  Constitutional: Positive for diaphoresis, fatigue and fever. Negative for appetite change, chills and unexpected weight change.  HENT: Positive for congestion, ear pain, facial swelling, postnasal drip, rhinorrhea, sinus pressure, sinus pain, sneezing and sore throat.  Negative for dental problem, drooling, ear discharge, hearing loss, mouth sores, nosebleeds, tinnitus, trouble swallowing and voice change.   Eyes: Negative.   Respiratory: Negative.   Cardiovascular: Negative.   Psychiatric/Behavioral: Negative.     Per HPI unless specifically indicated above     Objective:    BP 128/90   Pulse (!) 121   Temp 98.2 F (36.8 C) (Oral)   Ht 5\' 7"  (1.702 m)   Wt 199 lb (90.3 kg)   SpO2 98%   BMI 31.17 kg/m   Wt Readings from Last 3 Encounters:  05/26/18 199 lb (90.3 kg)  01/24/18 202 lb 9 oz (91.9 kg)  09/16/17 198 lb 9.6 oz (90.1 kg)    Physical Exam  Constitutional: She is oriented to person, place, and time. She appears well-developed and well-nourished. No distress.  HENT:  Head: Normocephalic and atraumatic.  Right Ear: Hearing, tympanic membrane, external ear and ear canal normal.  Left Ear: Hearing, tympanic membrane, external ear and ear canal normal.  Nose: Mucosal edema, rhinorrhea and sinus tenderness present. No nose lacerations, nasal deformity, septal deviation or nasal septal hematoma. No epistaxis.  No foreign bodies. Right sinus exhibits maxillary sinus tenderness.  Mouth/Throat: Uvula is midline, oropharynx is clear and moist and mucous membranes are normal.  + click and pain in R jaw   Eyes: Pupils are equal, round, and reactive to light. Conjunctivae, EOM and lids are normal. Right eye exhibits no discharge. Left eye exhibits no discharge.  No scleral icterus. Right eye exhibits normal extraocular motion and no nystagmus. Left eye exhibits normal extraocular motion and no nystagmus. Right pupil is round and reactive. Left pupil is round and reactive. Pupils are equal.  Neck: Normal range of motion. Neck supple. No JVD present. No neck rigidity. No tracheal deviation present. No Brudzinski's sign and no Kernig's sign noted.  Cardiovascular: Normal rate, regular rhythm, normal heart sounds and intact distal pulses. Exam reveals no  gallop and no friction rub.  No murmur heard. Pulmonary/Chest: Effort normal and breath sounds normal. No stridor. No respiratory distress. She has no wheezes. She has no rales. She exhibits no tenderness.  Abdominal: Soft. She exhibits no distension and no mass. There is no tenderness. There is no guarding.  Musculoskeletal: Normal range of motion.  Lymphadenopathy:    She has no cervical adenopathy.  Neurological: She is alert and oriented to person, place, and time.  Skin: Skin is warm, dry and intact. Capillary refill takes less than 2 seconds. No rash noted. She is not diaphoretic. No cyanosis or erythema. No pallor.  Psychiatric: She has a normal mood and affect. Her speech is normal and behavior is normal. Judgment and thought content normal. Cognition and memory are normal.  Nursing note and vitals reviewed.   Results for orders placed or performed in visit on 01/24/18  Urine Culture  Result Value Ref Range   Urine Culture, Routine Final report (A)    Organism ID, Bacteria Comment (A)    ORGANISM ID, BACTERIA Comment   Microscopic Examination  Result Value Ref Range   WBC, UA 6-10 (A) 0 - 5 /hpf   RBC, UA None seen 0 - 2 /hpf   Epithelial Cells (non renal) 0-10 0 - 10 /hpf   Bacteria, UA Few None seen/Few  Bayer DCA Hb A1c Waived  Result Value Ref Range   HB A1C (BAYER DCA - WAIVED) 5.1 <7.0 %  CBC with Differential/Platelet  Result Value Ref Range   WBC 10.9 (H) 3.4 - 10.8 x10E3/uL   RBC 4.43 3.77 - 5.28 x10E6/uL   Hemoglobin 12.9 11.1 - 15.9 g/dL   Hematocrit 16.1 09.6 - 46.6 %   MCV 85 79 - 97 fL   MCH 29.1 26.6 - 33.0 pg   MCHC 34.2 31.5 - 35.7 g/dL   RDW 04.5 40.9 - 81.1 %   Platelets 426 150 - 450 x10E3/uL   Neutrophils 61 Not Estab. %   Lymphs 29 Not Estab. %   Monocytes 8 Not Estab. %   Eos 2 Not Estab. %   Basos 0 Not Estab. %   Neutrophils Absolute 6.7 1.4 - 7.0 x10E3/uL   Lymphocytes Absolute 3.2 (H) 0.7 - 3.1 x10E3/uL   Monocytes Absolute 0.8 0.1 -  0.9 x10E3/uL   EOS (ABSOLUTE) 0.2 0.0 - 0.4 x10E3/uL   Basophils Absolute 0.0 0.0 - 0.2 x10E3/uL   Immature Granulocytes 0 Not Estab. %   Immature Grans (Abs) 0.0 0.0 - 0.1 x10E3/uL  Comprehensive metabolic panel  Result Value Ref Range   Glucose 77 65 - 99 mg/dL   BUN 9 6 - 24 mg/dL   Creatinine, Ser 9.14 (H) 0.57 - 1.00 mg/dL   GFR calc non Af Amer 62 >59 mL/min/1.73   GFR calc Af Amer 71 >59 mL/min/1.73   BUN/Creatinine Ratio 8 (L) 9 - 23   Sodium 138 134 - 144 mmol/L   Potassium 3.5 3.5 - 5.2 mmol/L   Chloride 101 96 - 106 mmol/L  CO2 23 20 - 29 mmol/L   Calcium 9.2 8.7 - 10.2 mg/dL   Total Protein 8.3 6.0 - 8.5 g/dL   Albumin 4.2 3.5 - 5.5 g/dL   Globulin, Total 4.1 1.5 - 4.5 g/dL   Albumin/Globulin Ratio 1.0 (L) 1.2 - 2.2   Bilirubin Total 0.3 0.0 - 1.2 mg/dL   Alkaline Phosphatase 173 (H) 39 - 117 IU/L   AST 45 (H) 0 - 40 IU/L   ALT 58 (H) 0 - 32 IU/L  Lipid Panel w/o Chol/HDL Ratio  Result Value Ref Range   Cholesterol, Total 207 (H) 100 - 199 mg/dL   Triglycerides 161 0 - 149 mg/dL   HDL 62 >09 mg/dL   VLDL Cholesterol Cal 30 5 - 40 mg/dL   LDL Calculated 604 (H) 0 - 99 mg/dL  Thyroid Panel With TSH  Result Value Ref Range   TSH 3.660 0.450 - 4.500 uIU/mL   T4, Total 7.0 4.5 - 12.0 ug/dL   T3 Uptake Ratio 20 (L) 24 - 39 %   Free Thyroxine Index 1.4 1.2 - 4.9  VITAMIN D 25 Hydroxy (Vit-D Deficiency, Fractures)  Result Value Ref Range   Vit D, 25-Hydroxy 12.6 (L) 30.0 - 100.0 ng/mL  UA/M w/rflx Culture, Routine  Result Value Ref Range   Specific Gravity, UA 1.020 1.005 - 1.030   pH, UA 5.5 5.0 - 7.5   Color, UA Yellow Yellow   Appearance Ur Turbid (A) Clear   Leukocytes, UA 1+ (A) Negative   Protein, UA Negative Negative/Trace   Glucose, UA Negative Negative   Ketones, UA Negative Negative   RBC, UA Negative Negative   Bilirubin, UA Negative Negative   Urobilinogen, Ur 1.0 0.2 - 1.0 mg/dL   Nitrite, UA Negative Negative   Microscopic Examination See  below:       Assessment & Plan:   Problem List Items Addressed This Visit      Musculoskeletal and Integument   TMJ (dislocation of temporomandibular joint)    No better. Will treat with prednisone and tizanidine. Rx for mouth guard given today. If not better in 2 weeks. Will get her into PT.        Other Visit Diagnoses    Acute non-recurrent maxillary sinusitis    -  Primary   Will treat with augmentin. Call with any concerns.    Relevant Medications   amoxicillin-clavulanate (AUGMENTIN) 875-125 MG tablet   predniSONE (DELTASONE) 50 MG tablet       Follow up plan: Return if symptoms worsen or fail to improve.

## 2018-06-01 ENCOUNTER — Telehealth: Payer: Self-pay | Admitting: Family Medicine

## 2018-06-01 DIAGNOSIS — S0300XA Dislocation of jaw, unspecified side, initial encounter: Secondary | ICD-10-CM

## 2018-06-01 NOTE — Telephone Encounter (Signed)
Copied from CRM [id number]. Topic: Quick Communication - See Telephone Encounter >> Jun 01, 2018 11:39 AM Angela Nevin wrote: CRM for notification. See Telephone encounter for: 06/01/18.  Pt states that she was advised by Dr. Laural Benes to contact office if the medication tiZANidine (ZANAFLEX) 4 MG tablet [[id number]] is not working for her. Pt states that the medication is not doing anything and would like to know if Dr. Laural Benes could call in a replacement or had any suggestions. Please advise.

## 2018-06-01 NOTE — Telephone Encounter (Signed)
Patient states she does not wish to pursue PT at this time. She does have an dentist appointment next Wednesday where they will take Xrays and fit her for a mouth guard to hopefully help the TMJ.

## 2018-06-01 NOTE — Telephone Encounter (Signed)
If not better, we would refer to PT. Referral generated.

## 2018-10-11 ENCOUNTER — Encounter: Payer: Self-pay | Admitting: Family Medicine

## 2018-10-11 ENCOUNTER — Ambulatory Visit (INDEPENDENT_AMBULATORY_CARE_PROVIDER_SITE_OTHER): Payer: PRIVATE HEALTH INSURANCE | Admitting: Family Medicine

## 2018-10-11 VITALS — BP 129/86 | HR 84 | Temp 98.6°F | Ht 67.0 in | Wt 202.5 lb

## 2018-10-11 DIAGNOSIS — J0100 Acute maxillary sinusitis, unspecified: Secondary | ICD-10-CM

## 2018-10-11 DIAGNOSIS — J3089 Other allergic rhinitis: Secondary | ICD-10-CM | POA: Diagnosis not present

## 2018-10-11 MED ORDER — AMOXICILLIN-POT CLAVULANATE 875-125 MG PO TABS: 1.0000 | ORAL_TABLET | Freq: Two times a day (BID) | ORAL | 0 refills | Status: DC

## 2018-10-11 MED ORDER — MONTELUKAST SODIUM 10 MG PO TABS: 10.0000 mg | ORAL_TABLET | Freq: Every day | ORAL | 1 refills | Status: DC

## 2018-10-11 NOTE — Progress Notes (Signed)
BP 129/86 (BP Location: Left Arm, Patient Position: Sitting, Cuff Size: Normal)   Pulse 84   Temp 98.6 F (37 C) (Oral)   Ht 5\' 7"  (1.702 m)   Wt 202 lb 8 oz (91.9 kg)   SpO2 99%   BMI 31.72 kg/m    Subjective:    Patient ID: Renee Parsons, female    DOB: [birth year], 48 y.o.   MRN: [id number]  HPI: Renee Parsons is a 48 y.o. female  Chief Complaint  Patient presents with  . Sinusitis    Ongoing 3 weeks. Bloody, yellow sputum from nose. Patient states drainage is going down her throat.  . Nasal Congestion   Here today with 3 weeks of ongoing congestion with bloody and thick yellow sputum, facial pain and pressure, sore throat, post-nasal drainage, malaise. Denies fevers, chills, N/V/D, CP, SOB. Taking OTC sinus medications with no relief. Hx of allergic rhinitis and frequent sinusitis taking occasional claritin. Does have flonase at home but does not use it.   Relevant past medical, surgical, family and social history reviewed and updated as indicated. Interim medical history since our last visit reviewed. Allergies and medications reviewed and updated.  Review of Systems  Per HPI unless specifically indicated above     Objective:    BP 129/86 (BP Location: Left Arm, Patient Position: Sitting, Cuff Size: Normal)   Pulse 84   Temp 98.6 F (37 C) (Oral)   Ht 5\' 7"  (1.702 m)   Wt 202 lb 8 oz (91.9 kg)   SpO2 99%   BMI 31.72 kg/m   Wt Readings from Last 3 Encounters:  10/11/18 202 lb 8 oz (91.9 kg)  05/26/18 199 lb (90.3 kg)  01/24/18 202 lb 9 oz (91.9 kg)    Physical Exam Vitals signs and nursing note reviewed.  Constitutional:      Appearance: Normal appearance.  HENT:     Head: Atraumatic.     Comments: B/l maxillary sinuses ttp    Right Ear: Tympanic membrane and external ear normal.     Left Ear: Tympanic membrane and external ear normal.     Nose: Congestion present.     Mouth/Throat:     Mouth: Mucous membranes are moist.     Pharynx:  Oropharyngeal exudate and posterior oropharyngeal erythema present.  Eyes:     Extraocular Movements: Extraocular movements intact.     Conjunctiva/sclera: Conjunctivae normal.  Neck:     Musculoskeletal: Normal range of motion and neck supple.  Cardiovascular:     Rate and Rhythm: Normal rate and regular rhythm.     Heart sounds: Normal heart sounds.  Pulmonary:     Effort: Pulmonary effort is normal.     Breath sounds: Normal breath sounds. No wheezing.  Musculoskeletal: Normal range of motion.  Skin:    General: Skin is warm and dry.  Neurological:     Mental Status: She is alert and oriented to person, place, and time.  Psychiatric:        Mood and Affect: Mood normal.        Thought Content: Thought content normal.     Results for orders placed or performed in visit on 10/11/18  Rapid Strep Screen ([facility] ONLY)  Result Value Ref Range   Strep Gp A Ag, IA W/Reflex Negative Negative  Culture, Group A Strep  Result Value Ref Range   Strep A Culture Negative       Assessment & Plan:   Problem  List Items Addressed This Visit      Respiratory   Allergic rhinitis    Add singulair, start claritin and flonase regularly for better allergy control.       Other Visit Diagnoses    Acute maxillary sinusitis, recurrence not specified    -  Primary   Tx with augmentin, increased allergy regimen, sinus rinses, humidifier. F/u if not improving   Relevant Medications   amoxicillin-clavulanate (AUGMENTIN) 875-125 MG tablet   Other Relevant Orders   Rapid Strep Screen ([facility] ONLY) (Completed)       Follow up plan: Return if symptoms worsen or fail to improve.

## 2018-10-11 NOTE — Patient Instructions (Signed)
Singulair, claritin once daily. Flonase twice daily. Sinus rinses as needed.

## 2018-10-14 LAB — RAPID STREP SCREEN (MED CTR MEBANE ONLY): STREP GP A AG, IA W/REFLEX: NEGATIVE

## 2018-10-14 LAB — CULTURE, GROUP A STREP: STREP A CULTURE: NEGATIVE

## 2018-10-15 DIAGNOSIS — J309 Allergic rhinitis, unspecified: Secondary | ICD-10-CM | POA: Insufficient documentation

## 2018-10-15 NOTE — Assessment & Plan Note (Signed)
Add singulair, start claritin and flonase regularly for better allergy control.

## 2019-03-07 ENCOUNTER — Other Ambulatory Visit: Payer: Self-pay

## 2019-03-07 DIAGNOSIS — Z20822 Contact with and (suspected) exposure to covid-19: Secondary | ICD-10-CM

## 2019-03-10 LAB — NOVEL CORONAVIRUS, NAA: SARS-CoV-2, NAA: NOT DETECTED

## 2019-10-05 ENCOUNTER — Ambulatory Visit: Payer: No Typology Code available for payment source | Attending: Internal Medicine

## 2019-10-05 DIAGNOSIS — Z23 Encounter for immunization: Secondary | ICD-10-CM | POA: Insufficient documentation

## 2019-10-05 NOTE — Progress Notes (Signed)
   Covid-19 Vaccination [facility]  Name:  Renee Parsons    MRN: [id number] DOB: Jul 14, 1971  10/05/2019  [name] was observed post Covid-19 immunization for 15 minutes without incidence. She was provided with Vaccine Information Sheet and instruction to access the V-Safe system.   [name] was instructed to call 911 with any severe reactions post vaccine: Marland Kitchen Difficulty breathing  . Swelling of your face and throat  . A fast heartbeat  . A bad rash all over your body  . Dizziness and weakness    Immunizations Administered    Name Date Dose VIS Date Route   Moderna COVID-19 Vaccine 10/05/2019 11:28 AM 0.5 mL 07/24/2019 Intramuscular   Manufacturer: Moderna   Lot: [id number]   NDC: 23343-568-61

## 2019-10-30 ENCOUNTER — Ambulatory Visit: Payer: PRIVATE HEALTH INSURANCE | Admitting: Nurse Practitioner

## 2019-10-30 ENCOUNTER — Telehealth (INDEPENDENT_AMBULATORY_CARE_PROVIDER_SITE_OTHER): Payer: PRIVATE HEALTH INSURANCE | Admitting: Nurse Practitioner

## 2019-10-30 ENCOUNTER — Encounter: Payer: Self-pay | Admitting: Nurse Practitioner

## 2019-10-30 VITALS — BP 117/86 | HR 95 | Temp 98.5°F | Wt 205.0 lb

## 2019-10-30 DIAGNOSIS — Z1231 Encounter for screening mammogram for malignant neoplasm of breast: Secondary | ICD-10-CM | POA: Diagnosis not present

## 2019-10-30 DIAGNOSIS — Z1211 Encounter for screening for malignant neoplasm of colon: Secondary | ICD-10-CM | POA: Diagnosis not present

## 2019-10-30 DIAGNOSIS — S0300XD Dislocation of jaw, unspecified side, subsequent encounter: Secondary | ICD-10-CM | POA: Diagnosis not present

## 2019-10-30 DIAGNOSIS — J3089 Other allergic rhinitis: Secondary | ICD-10-CM

## 2019-10-30 MED ORDER — MONTELUKAST SODIUM 10 MG PO TABS: 10.0000 mg | ORAL_TABLET | Freq: Every day | ORAL | 3 refills | Status: DC

## 2019-10-30 MED ORDER — LORATADINE 10 MG PO TABS: 10.0000 mg | ORAL_TABLET | Freq: Every day | ORAL | 3 refills | Status: DC

## 2019-10-30 MED ORDER — TIZANIDINE HCL 4 MG PO TABS: 4.0000 mg | ORAL_TABLET | Freq: Four times a day (QID) | ORAL | 1 refills | Status: DC | PRN

## 2019-10-30 NOTE — Patient Instructions (Signed)
  Jaw Range of Motion Exercises Jaw range of motion exercises are exercises that help your jaw move better. Exercises that help you have good posture (postural exercises) also help relieve jaw discomfort. These are often done along with range of motion exercises. These exercises can help prevent or improve:  Difficulty opening your mouth.  Pain in your jaw while it is open or closed.  Temporomandibular joint (TMJ) pain.  Headache caused by jaw tension. Take other actions to prevent or relieve jaw pain, such as:  Avoiding things that cause or increase jaw pain. This may include: ? Chewing gum or eating hard foods. ? Clenching your jaw or teeth, grinding your teeth, or keeping tension in your jaw muscles. ? Opening your mouth wide, such as for a big yawn. ? Leaning on your jaw, such as resting your jaw in your hand while leaning on a desk.  Putting ice on your jaw. ? Put ice in a plastic bag. ? Place a towel between your skin and the bag. ? Leave the ice on for 10-15 minutes, 2-3 times a day. Only do jaw exercises that your health care provider approves of. Only move your jaw as far as it can comfortably go in each direction. Do not move your jaw into positions that cause pain. Range of motion exercises Repeat each of these exercises 8 times, 1-2 times a day, or as told by your health care provider. Exercise A: Forward protrusion 1. Push your jaw forward. Hold this position for 1-2 seconds. 2. Allow your jaw to return to its normal position and rest it there for 1-2 seconds. Exercise B: Controlled opening 1. Stand or sit in front of a mirror. Place your tongue on the roof of your mouth, just behind your top teeth. 2. Keeping your tongue on the roof of your mouth, slowly open and close your mouth. 3. While you open and close your mouth, watch your jaw in the mirror. Try to keep your jaw from moving to one side or the other. Exercise C: Right and left motion 1. Move your jaw right.  Hold this position for 1-2 seconds. Allow your jaw to return to its normal position, and rest it there for 1-2 seconds. 2. Move your jaw left. Hold this position for 1-2 seconds. Allow your jaw to return to its normal position, and rest it there for 1-2 seconds. Postural exercises Exercise A: Chin tucks 1. You can do this exercise sitting, standing, or lying down. 2. Move your head straight back, keeping your head level. You can guide the movement by placing your fingers on your chin to push your jaw back in an even motion. You should be able to feel a double chin form at the end of the motion. 3. Hold this position for 5 seconds. Repeat 10-15 times. Exercise B: Shoulder blade squeeze 1. Sit or stand. 2. Bend your elbows to about 90 degrees, which is the shape of a capital letter "L." Keep your upper arms by your body. 3. Squeeze your shoulder blades down and back, as though you were trying to touch your elbows behind you. Do not shrug your shoulders or move your head. 4. Hold this position for 5 seconds. Repeat 10-15 times. Exercise C: Chest stretch 1. Stand facing a corner. 2. Put both of your hands and your forearms on the wall, with your arms wide apart. 3. Make sure your arms are at a 90-degree angle to your body. This means that you should hold your   arms straight out from your body, level with the floor. 4. Step in toward the corner. Do not lean in. 5. Hold this position for 30 seconds. Repeat 3 times. Contact a health care provider if you have:  Jaw pain that is new or gets worse.  Clicking or popping sounds while doing the exercises. Get help right away if:  Your jaw is stuck in one place and you cannot move it.  You cannot open or close your mouth. This information is not intended to replace advice given to you by your health care provider. Make sure you discuss any questions you have with your health care provider. Document Revised: 12/01/2018 Document Reviewed:  07/06/2017 Elsevier Patient Education  2020 [employer]  

## 2019-10-30 NOTE — Progress Notes (Signed)
BP 117/86   Pulse 95   Temp 98.5 F (36.9 C)   Wt 205 lb (93 kg)   BMI 32.11 kg/m    Subjective:    Patient ID: Renee Parsons, female    DOB: 03-02-1971, 49 y.o.   MRN: [id number]  HPI: Renee Parsons is a 49 y.o. female presenting for refill of allergy medication and muscle relaxer.  Chief Complaint  Patient presents with  . Temporomandibular Joint Pain    refill on muscle relaxer  . Allergies    Refill on singulair   ALLERGIES Duration: chronic  Runny nose: yes "clear Nasal congestion: no Nasal itching: no Sneezing: no Eye swelling, itching or discharge: no Post nasal drip: no Cough: no Sinus pressure: no  Ear pain: yes "right; from TMJ Ear pressure: no  Fever: no  Symptoms occur seasonally: no Symptoms occur perenially: yes Satisfied with current treatment: yes Allergist evaluation in past: no Allergen injection immunotherapy: no Recurrent sinus infections: no ENT evaluation in past: no Known environmental allergy: no History of asthma: no Current allergy medications: claiatin, singulair, neti pot, benadryl prn  Treatments attempted: claritin, singulair, neti pot, benadryl prn; patient reports under good control on this regimen but is out of medication  Patient feels the zanaflex helps with the pain she gets from clenching teeth at night.  She recently moved and misplaced the mouth guard that prevents this pain.  She has a dentist appointment on July 15th to get another mouth guard.  No Known Allergies  Outpatient Encounter Medications as of 10/30/2019  Medication Sig  . loratadine (CLARITIN) 10 MG tablet Take 1 tablet (10 mg total) by mouth daily.  . montelukast (SINGULAIR) 10 MG tablet Take 1 tablet (10 mg total) by mouth at bedtime.  Marland Kitchen tiZANidine (ZANAFLEX) 4 MG tablet Take 1 tablet (4 mg total) by mouth every 6 (six) hours as needed for muscle spasms.  . [DISCONTINUED] amoxicillin-clavulanate (AUGMENTIN) 875-125 MG tablet Take 1 tablet by mouth 2  (two) times daily.  . [DISCONTINUED] loratadine (CLARITIN) 10 MG tablet Take 10 mg by mouth daily.  . [DISCONTINUED] montelukast (SINGULAIR) 10 MG tablet Take 1 tablet (10 mg total) by mouth at bedtime.  . [DISCONTINUED] tiZANidine (ZANAFLEX) 4 MG tablet Take 1 tablet (4 mg total) by mouth every 6 (six) hours as needed for muscle spasms.   No facility-administered encounter medications on file as of 10/30/2019.   Patient Active Problem List   Diagnosis Date Noted  . Allergic rhinitis 10/15/2018  . Tobacco abuse 01/24/2018  . 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 Chronic back pain     Relevant past medical, surgical, family and social history reviewed and updated as indicated.   Review of Systems  Constitutional: Negative.  Negative for activity change, chills, fatigue and fever.  HENT: Positive for dental problem (clenching teeth at night), ear pain, facial swelling and rhinorrhea. Negative for congestion, ear discharge, hearing loss, nosebleeds, postnasal drip, sinus pressure, sinus pain, sneezing, sore throat and tinnitus.   Eyes: Negative.  Negative for discharge, redness, itching and visual disturbance.  Musculoskeletal: Negative.   Neurological: Negative.   Psychiatric/Behavioral: Negative.    Per HPI unless specifically indicated above     Objective:    BP 117/86   Pulse 95   Temp 98.5 F (36.9 C)   Wt 205 lb (93 kg)   BMI 32.11 kg/m   Wt Readings from Last 3 Encounters:  10/30/19 205 lb (93 kg)  10/11/18 202 lb 8 oz (91.9 kg)  05/26/18 199 lb (90.3 kg)    Physical Exam Vitals and nursing note reviewed.  Constitutional:      General: She is not in acute distress.    Appearance: Normal appearance.  HENT:     Nose: Nose normal. No congestion.  Eyes:     General: No scleral icterus.       Right eye: No discharge.        Left eye: No discharge.     Extraocular Movements: Extraocular movements intact.    Musculoskeletal:        General: Normal range of motion.     Cervical back: Normal range of motion. No rigidity.  Neurological:     General: No focal deficit present.     Mental Status: She is alert and oriented to person, place, and time.     Motor: No weakness.     Gait: Gait normal.  Psychiatric:        Mood and Affect: Mood normal.        Behavior: Behavior normal.        Thought Content: Thought content normal.        Judgment: Judgment normal.       Assessment & Plan:   Problem List Items Addressed This Visit      Respiratory   Allergic rhinitis    Chronic, stable.  Well controlled on current regimen, no sinus infections in >1 year.  Continue regimen, will refill singular and loratadine.  Patient to call or return to clinic with concerns.      Relevant Medications   montelukast (SINGULAIR) 10 MG tablet   loratadine (CLARITIN) 10 MG tablet     Musculoskeletal and Integument   TMJ (dislocation of temporomandibular joint) - Primary    Chronic, ongoing.  Will give refill of muscle relaxer until able to get mouth guard re-fitted from dentist.  Consider PT if pain continues while taking muscle relaxer and unable to quickly get into dentist.      Relevant Medications   tiZANidine (ZANAFLEX) 4 MG tablet    Other Visit Diagnoses    Encounter for screening mammogram for malignant neoplasm of breast       Relevant Orders   MM DIGITAL SCREENING BILATERAL   Screening for colon cancer       Relevant Orders   Ambulatory referral to Gastroenterology       Follow up plan: Return in about 4 weeks (around 11/27/2019) for complete physical with fasting labs.

## 2019-10-30 NOTE — Assessment & Plan Note (Signed)
Chronic, ongoing.  Will give refill of muscle relaxer until able to get mouth guard re-fitted from dentist.  Consider PT if pain continues while taking muscle relaxer and unable to quickly get into dentist.

## 2019-10-30 NOTE — Assessment & Plan Note (Signed)
Chronic, stable.  Well controlled on current regimen, no sinus infections in >1 year.  Continue regimen, will refill singular and loratadine.  Patient to call or return to clinic with concerns.

## 2019-11-01 ENCOUNTER — Other Ambulatory Visit: Payer: Self-pay

## 2019-11-01 ENCOUNTER — Telehealth: Payer: Self-pay

## 2019-11-01 DIAGNOSIS — Z1211 Encounter for screening for malignant neoplasm of colon: Secondary | ICD-10-CM

## 2019-11-01 NOTE — Telephone Encounter (Signed)
Gastroenterology Pre-Procedure Review  Request Date: Thursday 12/06/19 Requesting Physician: Dr. Allegra Lai  PATIENT REVIEW QUESTIONS: The patient responded to the following health history questions as indicated:    1. Are you having any GI issues? no 2. Do you have a personal history of Polyps? no 3. Do you have a family history of Colon Cancer or Polyps? no 4. Diabetes Mellitus? no 5. Joint replacements in the past 12 months?no 6. Major health problems in the past 3 months?no 7. Any artificial heart valves, MVP, or defibrillator?no    MEDICATIONS & ALLERGIES:    Patient reports the following regarding taking any anticoagulation/antiplatelet therapy:   Plavix, Coumadin, Eliquis, Xarelto, Lovenox, Pradaxa, Brilinta, or Effient? no Aspirin? no  Patient confirms/reports the following medications:  Current Outpatient Medications  Medication Sig Dispense Refill  . loratadine (CLARITIN) 10 MG tablet Take 1 tablet (10 mg total) by mouth daily. 90 tablet 3  . montelukast (SINGULAIR) 10 MG tablet Take 1 tablet (10 mg total) by mouth at bedtime. 90 tablet 3  . tiZANidine (ZANAFLEX) 4 MG tablet Take 1 tablet (4 mg total) by mouth every 6 (six) hours as needed for muscle spasms. 90 tablet 1   No current facility-administered medications for this visit.    Patient confirms/reports the following allergies:  No Known Allergies  No orders of the defined types were placed in this encounter.   AUTHORIZATION INFORMATION Primary Insurance: 1D#: Group #:  Secondary Insurance: 1D#: Group #:  SCHEDULE INFORMATION: Date: Thursday 12/06/19 Time: Location:[facility]

## 2019-11-01 NOTE — Progress Notes (Signed)
colon

## 2019-11-05 ENCOUNTER — Ambulatory Visit: Payer: No Typology Code available for payment source | Attending: Internal Medicine

## 2019-11-05 DIAGNOSIS — Z23 Encounter for immunization: Secondary | ICD-10-CM

## 2019-11-05 NOTE — Progress Notes (Signed)
   Covid-19 Vaccination Clinic  Name:  Renee Parsons    MRN: [id number] DOB: Sep 09, 1970  11/05/2019  [name] was observed post Covid-19 immunization for 15 minutes without incident. She was provided with Vaccine Information Sheet and instruction to access the V-Safe system.   [name] was instructed to call 911 with any severe reactions post vaccine: Marland Kitchen Difficulty breathing  . Swelling of face and throat  . A fast heartbeat  . A bad rash all over body  . Dizziness and weakness   Immunizations Administered    Name Date Dose VIS Date Route   Moderna COVID-19 Vaccine 11/05/2019 11:30 AM 0.5 mL 07/24/2019 Intramuscular   Manufacturer: Moderna   Lot: [id number]   NDC: 38756-433-29

## 2019-11-20 ENCOUNTER — Ambulatory Visit (INDEPENDENT_AMBULATORY_CARE_PROVIDER_SITE_OTHER): Payer: PRIVATE HEALTH INSURANCE | Admitting: Nurse Practitioner

## 2019-11-20 ENCOUNTER — Other Ambulatory Visit: Payer: Self-pay

## 2019-11-20 ENCOUNTER — Other Ambulatory Visit (HOSPITAL_COMMUNITY)
Admission: RE | Admit: 2019-11-20 | Discharge: 2019-11-20 | Disposition: A | Discharge status: Home / Self Care | Payer: No Typology Code available for payment source | Source: Ambulatory Visit | Attending: Nurse Practitioner | Admitting: Nurse Practitioner

## 2019-11-20 ENCOUNTER — Encounter: Payer: Self-pay | Admitting: Nurse Practitioner

## 2019-11-20 VITALS — BP 136/85 | HR 73 | Temp 98.3°F | Ht 65.75 in | Wt 196.2 lb

## 2019-11-20 DIAGNOSIS — Z1329 Encounter for screening for other suspected endocrine disorder: Secondary | ICD-10-CM

## 2019-11-20 DIAGNOSIS — Z13220 Encounter for screening for lipoid disorders: Secondary | ICD-10-CM | POA: Diagnosis not present

## 2019-11-20 DIAGNOSIS — Z124 Encounter for screening for malignant neoplasm of cervix: Secondary | ICD-10-CM

## 2019-11-20 DIAGNOSIS — Z0000 Encounter for general adult medical examination without abnormal findings: Secondary | ICD-10-CM | POA: Diagnosis not present

## 2019-11-20 DIAGNOSIS — R5383 Other fatigue: Secondary | ICD-10-CM

## 2019-11-20 DIAGNOSIS — Z713 Dietary counseling and surveillance: Secondary | ICD-10-CM | POA: Insufficient documentation

## 2019-11-20 DIAGNOSIS — L600 Ingrowing nail: Secondary | ICD-10-CM | POA: Insufficient documentation

## 2019-11-20 LAB — UA/M W/RFLX CULTURE, ROUTINE
Bilirubin, UA: NEGATIVE
Glucose, UA: NEGATIVE
Ketones, UA: NEGATIVE
Leukocytes,UA: NEGATIVE
Nitrite, UA: NEGATIVE
Protein,UA: NEGATIVE
RBC, UA: NEGATIVE
Specific Gravity, UA: 1.025 (ref 1.005–1.030)
Urobilinogen, Ur: 0.2 mg/dL (ref 0.2–1.0)
pH, UA: 5.5 (ref 5.0–7.5)

## 2019-11-20 LAB — BAYER DCA HB A1C WAIVED: HB A1C (BAYER DCA - WAIVED): 5.4 % (ref <7.0)

## 2019-11-20 NOTE — Assessment & Plan Note (Signed)
Desiring weight loss - discussed healthy weight loss with the patient.  Appears she has lost ~9 pounds since last visit about 1 month ago.  This appears to be a healthy weight loss trend.  Encouraged daily exercise and use of gym membership to enhance weight loss and increase muscle strengthening activities.  Continue avoidance of simple carbohydrates and sugary foods and drinks.  Will follow up in 2 months on weight loss progression.

## 2019-11-20 NOTE — Progress Notes (Signed)
BP 136/85 (BP Location: Left Arm, Patient Position: Sitting, Cuff Size: Normal)   Pulse 73   Temp 98.3 F (36.8 C) (Oral)   Ht 5' 5.75" (1.67 m)   Wt 196 lb 3.2 oz (89 kg)   SpO2 99%   BMI 31.91 kg/m    Subjective:    Patient ID: Renee Parsons, female    DOB: August 04, 1971, 49 y.o.   MRN: [id number]  HPI: Renee Parsons is a 49 y.o. female presenting on 11/20/2019 for comprehensive medical examination. Current medical complaints include:  Patient reports that since she stopped smoking, she has been gaining more weight and has been having more issues with rashes in her folds because of sweating.  She stopped salt and bread, and fried foods.  She eats more fruits and vegetables.  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   Ingrown toenails - both of her big toes.  She has been going to get pedicrures and the nail tech told her to get them removed . She says she does not want them to get infected.  She reports they bleed a lot at the nail place.  She says they are painful.    She currently lives with: self Menopausal Symptoms: no  Depression Screen done today and results listed below:  Depression screen [facility] 2/9 11/20/2019 10/30/2019 01/24/2018  Decreased Interest 0 0 0  Down, Depressed, Hopeless 0 0 0  PHQ - 2 Score 0 0 0  Altered sleeping 0 - 3  Tired, decreased energy 0 - 0  Change in appetite 0 - 0  Feeling bad or failure about yourself  0 - 0  Trouble concentrating 0 - 0  Moving slowly or fidgety/restless 0 - 0  Suicidal thoughts 0 - 0  PHQ-9 Score 0 - 3  Difficult doing work/chores Not difficult at all - Not difficult at all   The patient does not have a history of falls. I did not complete a risk assessment for falls. A plan of care for falls was not documented.  Past Medical History:  Past Medical History:  Diagnosis Date  . Chronic back pain     Surgical History:  Past Surgical History:  Procedure Laterality Date  . ABDOMINAL HYSTERECTOMY      Medications:    Current Outpatient Medications on File Prior to Visit  Medication Sig  . loratadine (CLARITIN) 10 MG tablet Take 1 tablet (10 mg total) by mouth daily.  . montelukast (SINGULAIR) 10 MG tablet Take 1 tablet (10 mg total) by mouth at bedtime.  . Naproxen Sodium (ALEVE) 220 MG CAPS Take 220 mg by mouth daily.  Marland Kitchen tiZANidine (ZANAFLEX) 4 MG tablet Take 1 tablet (4 mg total) by mouth every 6 (six) hours as needed for muscle spasms.   No current facility-administered medications on file prior to visit.    Allergies:  No Known Allergies  Social History:  Social History   Socioeconomic History  . Marital status: Married    Spouse name: Not on file  . Number of children: Not on file  . Years of education: Not on file  . Highest education level: Not on file  Occupational History  . Not on file  Tobacco Use  . Smoking status: Former Smoker    Quit date: 08/23/2016    Years since quitting: 3.2  . Smokeless tobacco: Never Used  Substance and Sexual Activity  . Alcohol use: Yes    Alcohol/week: 4.0 standard drinks    Types:  4 Glasses of wine per week  . Drug use: No  . Sexual activity: Yes    Birth control/protection: Surgical  Other Topics Concern  . Not on file  Social History Narrative  . Not on file   Social Determinants of Health   Financial Resource Strain:   . Difficulty of Paying Living Expenses:   Food Insecurity:   . Worried About Programme researcher, broadcasting/film/videounning Out of Food in the Last Year:   . Baristaan Out of Food in the Last Year:   Transportation Needs:   . Freight forwarderLack of Transportation (Medical):   Marland Kitchen. Lack of Transportation (Non-Medical):   Physical Activity:   . Days of Exercise per Week:   . Minutes of Exercise per Session:   Stress:   . Feeling of Stress :   Social Connections:   . Frequency of Communication with Friends and Family:   . Frequency of Social Gatherings with Friends and Family:   . Attends Religious Services:   . Active Member of Clubs or Organizations:   . Attends Tax inspectorClub or  Organization Meetings:   Marland Kitchen. Marital Status:   Intimate Partner Violence:   . Fear of Current or Ex-Partner:   . Emotionally Abused:   Marland Kitchen. Physically Abused:   . Sexually Abused:    Social History   Tobacco Use  Smoking Status Former Smoker  . Quit date: 08/23/2016  . Years since quitting: 3.2  Smokeless Tobacco Never Used   Social History   Substance and Sexual Activity  Alcohol Use Yes  . Alcohol/week: 4.0 standard drinks  . Types: 4 Glasses of wine per week    Family History:  Family History  Problem Relation Age of Onset  . Migraines Mother   . Diabetes Maternal Grandmother     Past medical history, surgical history, medications, allergies, family history and social history reviewed with patient today and changes made to appropriate areas of the chart.   Review of Systems  Constitutional: Positive for malaise/fatigue. Negative for chills, diaphoresis and fever.  HENT: Negative.  Negative for ear pain, hearing loss and sore throat.   Eyes: Negative.  Negative for blurred vision and double vision.  Respiratory: Negative.  Negative for cough, shortness of breath and wheezing.   Cardiovascular: Negative.  Negative for chest pain, palpitations and leg swelling.  Gastrointestinal: Negative.  Negative for abdominal pain, diarrhea, nausea and vomiting.  Genitourinary: Negative.  Negative for dysuria, frequency, hematuria and urgency.  Musculoskeletal: Negative.  Negative for back pain, joint pain and neck pain.  Skin: Positive for rash. Negative for itching.  Neurological: Negative.  Negative for dizziness, weakness and headaches.  Psychiatric/Behavioral: Negative.  Negative for depression. The patient is not nervous/anxious and does not have insomnia.    All other ROS negative except what is listed above and in the HPI.     Objective:    BP 136/85 (BP Location: Left Arm, Patient Position: Sitting, Cuff Size: Normal)   Pulse 73   Temp 98.3 F (36.8 C) (Oral)   Ht 5' 5.75"  (1.67 m)   Wt 196 lb 3.2 oz (89 kg)   SpO2 99%   BMI 31.91 kg/m   Wt Readings from Last 3 Encounters:  11/20/19 196 lb 3.2 oz (89 kg)  10/30/19 205 lb (93 kg)  10/11/18 202 lb 8 oz (91.9 kg)    Physical Exam Vitals and nursing note reviewed. Exam conducted with a chaperone present.  Constitutional:      General: She is not in acute distress.  Appearance: Normal appearance. She is normal weight. She is not ill-appearing or toxic-appearing.  HENT:     Head: Normocephalic and atraumatic.     Right Ear: Tympanic membrane, ear canal and external ear normal.     Left Ear: Tympanic membrane, ear canal and external ear normal.     Nose: Nose normal. No congestion or rhinorrhea.     Mouth/Throat:     Mouth: Mucous membranes are moist.     Pharynx: Oropharynx is clear. No oropharyngeal exudate.  Eyes:     General: No scleral icterus.    Extraocular Movements: Extraocular movements intact.     Pupils: Pupils are equal, round, and reactive to light.  Cardiovascular:     Rate and Rhythm: Normal rate and regular rhythm.     Pulses: Normal pulses.     Heart sounds: Normal heart sounds. No murmur.  Pulmonary:     Effort: Pulmonary effort is normal. No respiratory distress.     Breath sounds: No wheezing or rhonchi.  Chest:     Breasts:        Right: Normal. No inverted nipple, mass, nipple discharge or skin change.        Left: Normal. No inverted nipple, mass, nipple discharge or skin change.  Abdominal:     General: Abdomen is flat. Bowel sounds are normal. There is no distension.     Palpations: Abdomen is soft.     Tenderness: There is no abdominal tenderness.  Genitourinary:    General: Normal vulva.     Exam position: Lithotomy position.     Pubic Area: No rash or pubic lice.      Labia:        Right: No rash or lesion.        Left: No rash or lesion.      Vagina: Normal. No vaginal discharge, erythema, bleeding or lesions.     Uterus: Absent.      Adnexa: Right adnexa  normal and left adnexa normal.       Right: No mass or tenderness.         Left: No mass or tenderness.    Musculoskeletal:        General: No swelling or tenderness. Normal range of motion.     Cervical back: Normal range of motion and neck supple. No rigidity or tenderness.     Right lower leg: No edema.     Left lower leg: No edema.  Lymphadenopathy:     Upper Body:     Right upper body: No supraclavicular or axillary adenopathy.     Left upper body: No supraclavicular or axillary adenopathy.  Skin:    General: Skin is warm and dry.     Capillary Refill: Capillary refill takes less than 2 seconds.     Coloration: Skin is not jaundiced or pale.  Neurological:     General: No focal deficit present.     Mental Status: She is alert and oriented to person, place, and time.     Motor: No weakness.     Gait: Gait normal.  Psychiatric:        Mood and Affect: Mood normal.        Behavior: Behavior normal.        Thought Content: Thought content normal.        Judgment: Judgment normal.       Assessment & Plan:   Problem List Items Addressed This Visit      Musculoskeletal  and Integument   Ingrown nail    Chronic, ongoing of bilateral great toes.  Patient desires removal, advised of s/s of infection and to monitor for prior to removal.  Schedule future visit for removal in office.         Other   Weight loss counseling, encounter for    Desiring weight loss - discussed healthy weight loss with the patient.  Appears she has lost ~9 pounds since last visit about 1 month ago.  This appears to be a healthy weight loss trend.  Encouraged daily exercise and use of gym membership to enhance weight loss and increase muscle strengthening activities.  Continue avoidance of simple carbohydrates and sugary foods and drinks.  Will follow up in 2 months on weight loss progression.       Other Visit Diagnoses    Annual physical exam    -  Primary   Relevant Orders   Bayer DCA Hb A1c Waived    UA/M w/rflx Culture, Routine   CBC with Differential/Platelet   Comprehensive metabolic panel   Cervical cancer screening       Relevant Orders   Cytology - PAP   Screening cholesterol level       Relevant Orders   Lipid Panel w/o Chol/HDL Ratio   Thyroid disorder screening       Relevant Orders   TSH   Fatigue, unspecified type       Relevant Orders   Bayer DCA Hb A1c Waived   UA/M w/rflx Culture, Routine   CBC with Differential/Platelet   Comprehensive metabolic panel   TSH   Vitamin D (25 hydroxy)       Follow up plan: Return in about 2 weeks (around 12/04/2019) for bilateral toenail removal with Dr. Wynetta Emery.   LABORATORY TESTING:   - Pap smear: pap done  IMMUNIZATIONS:   - Tdap: Tetanus vaccination status reviewed: last tetanus booster 10 years ago; desired to wait due to recently receiving covid vaccine - Influenza: Refused - Pneumovax: Not applicable - Prevnar: Not applicable - HPV: Not applicable - Zostavax vaccine: Not applicable  SCREENING: -Mammogram: Ordered and pending  - Colonoscopy: Ordered and pending  - Bone Density: Not applicable  -Hearing Test: Not applicable  -Spirometry: Not applicable   PATIENT COUNSELING:   Advised to take 1 mg of folate supplement per day if capable of pregnancy.   Sexuality: Discussed sexually transmitted diseases, partner selection, use of condoms, avoidance of unintended pregnancy  and contraceptive alternatives.   Advised to avoid cigarette smoking.  I discussed with the patient that most people either abstain from alcohol or drink within safe limits (<=14/week and <=4 drinks/occasion for males, <=7/weeks and <= 3 drinks/occasion for females) and that the risk for alcohol disorders and other health effects rises proportionally with the number of drinks per week and how often a drinker exceeds daily limits.  Discussed cessation/primary prevention of drug use and availability of treatment for abuse.   Diet:  Encouraged to adjust caloric intake to maintain  or achieve ideal body weight, to reduce intake of dietary saturated fat and total fat, to limit sodium intake by avoiding high sodium foods and not adding table salt, and to maintain adequate dietary potassium and calcium preferably from fresh fruits, vegetables, and low-fat dairy products.   stressed the importance of regular exercise  Injury prevention: Discussed safety belts, safety helmets, smoke detector, smoking near bedding or upholstery.   Dental health: Discussed importance of regular tooth brushing, flossing, and dental  visits.    NEXT PREVENTATIVE PHYSICAL DUE IN 1 YEAR. Return in about 2 weeks (around 12/04/2019) for bilateral toenail removal with Dr. Laural Benes.

## 2019-11-20 NOTE — Patient Instructions (Addendum)
Nice seeing you today! We will call you with your labs results tomorrow.  Take care, - Renee Parsons  Preventive Care 38-50 Years Old, Female Preventive care refers to visits with your health care provider and lifestyle choices that can promote health and wellness. This includes:  A yearly physical exam. This may also be called an annual well check.  Regular dental visits and eye exams.  Immunizations.  Screening for certain conditions.  Healthy lifestyle choices, such as eating a healthy diet, getting regular exercise, not using drugs or products that contain nicotine and tobacco, and limiting alcohol use. What can I expect for my preventive care visit? Physical exam Your health care provider will check your:  Height and weight. This may be used to calculate body mass index (BMI), which tells if you are at a healthy weight.  Heart rate and blood pressure.  Skin for abnormal spots. Counseling Your health care provider may ask you questions about your:  Alcohol, tobacco, and drug use.  Emotional well-being.  Home and relationship well-being.  Sexual activity.  Eating habits.  Work and work Statistician.  Method of birth control.  Menstrual cycle.  Pregnancy history. What immunizations do I need?  Influenza (flu) vaccine  This is recommended every year. Tetanus, diphtheria, and pertussis (Tdap) vaccine  You may need a Td booster every 10 years. Varicella (chickenpox) vaccine  You may need this if you have not been vaccinated. Zoster (shingles) vaccine  You may need this after age 28. Measles, mumps, and rubella (MMR) vaccine  You may need at least one dose of MMR if you were born in 1957 or later. You may also need a second dose. Pneumococcal conjugate (PCV13) vaccine  You may need this if you have certain conditions and were not previously vaccinated. Pneumococcal polysaccharide (PPSV23) vaccine  You may need one or two doses if you smoke cigarettes or if you  have certain conditions. Meningococcal conjugate (MenACWY) vaccine  You may need this if you have certain conditions. Hepatitis A vaccine  You may need this if you have certain conditions or if you travel or work in places where you may be exposed to hepatitis A. Hepatitis B vaccine  You may need this if you have certain conditions or if you travel or work in places where you may be exposed to hepatitis B. Haemophilus influenzae type b (Hib) vaccine  You may need this if you have certain conditions. Human papillomavirus (HPV) vaccine  If recommended by your health care provider, you may need three doses over 6 months. You may receive vaccines as individual doses or as more than one vaccine together in one shot (combination vaccines). Talk with your health care provider about the risks and benefits of combination vaccines. What tests do I need? Blood tests  Lipid and cholesterol levels. These may be checked every 5 years, or more frequently if you are over 50 years old.  Hepatitis C test.  Hepatitis B test. Screening  Lung cancer screening. You may have this screening every year starting at age 50 if you have a 30-pack-year history of smoking and currently smoke or have quit within the past 15 years.  Colorectal cancer screening. All adults should have this screening starting at age 50 and continuing until age 80. Your health care provider may recommend screening at age 50 if you are at increased risk. You will have tests every 1-10 years, depending on your results and the type of screening test.  Diabetes screening. This is done  by checking your blood sugar (glucose) after you have not eaten for a while (fasting). You may have this done every 1-3 years.  Mammogram. This may be done every 1-2 years. Talk with your health care provider about when you should start having regular mammograms. This may depend on whether you have a family history of breast cancer.  BRCA-related cancer  screening. This may be done if you have a family history of breast, ovarian, tubal, or peritoneal cancers.  Pelvic exam and Pap test. This may be done every 3 years starting at age 50. Starting at age 50, this may be done every 5 years if you have a Pap test in combination with an HPV test. Other tests  Sexually transmitted disease (STD) testing.  Bone density scan. This is done to screen for osteoporosis. You may have this scan if you are at high risk for osteoporosis. Follow these instructions at home: Eating and drinking  Eat a diet that includes fresh fruits and vegetables, whole grains, lean protein, and low-fat dairy.  Take vitamin and mineral supplements as recommended by your health care provider.  Do not drink alcohol if: ? Your health care provider tells you not to drink. ? You are pregnant, may be pregnant, or are planning to become pregnant.  If you drink alcohol: ? Limit how much you have to 0-1 drink a day. ? Be aware of how much alcohol is in your drink. In the [place] one drink equals one 12 oz bottle of beer (355 mL), one 5 oz glass of wine (148 mL), or one 1 oz glass of hard liquor (44 mL). Lifestyle  Take daily care of your teeth and gums.  Stay active. Exercise for at least 30 minutes on 5 or more days each week.  Do not use any products that contain nicotine or tobacco, such as cigarettes, e-cigarettes, and chewing tobacco. If you need help quitting, ask your health care provider.  If you are sexually active, practice safe sex. Use a condom or other form of birth control (contraception) in order to prevent pregnancy and STIs (sexually transmitted infections).  If told by your health care provider, take low-dose aspirin daily starting at age 36. What's next?  Visit your health care provider once a year for a well check visit.  Ask your health care provider how often you should have your eyes and teeth checked.  Stay up to date on all vaccines. This  information is not intended to replace advice given to you by your health care provider. Make sure you discuss any questions you have with your health care provider. Document Revised: 04/20/2018 Document Reviewed: 04/20/2018 Elsevier Patient Education  2020 [employer].

## 2019-11-20 NOTE — Assessment & Plan Note (Signed)
Chronic, ongoing of bilateral great toes.  Patient desires removal, advised of s/s of infection and to monitor for prior to removal.  Schedule future visit for removal in office.

## 2019-11-21 LAB — COMPREHENSIVE METABOLIC PANEL
ALT: 30 IU/L (ref 0–32)
AST: 31 IU/L (ref 0–40)
Albumin/Globulin Ratio: 1.3 (ref 1.2–2.2)
Albumin: 4.4 g/dL (ref 3.8–4.8)
Alkaline Phosphatase: 166 IU/L — ABNORMAL HIGH (ref 39–117)
BUN/Creatinine Ratio: 12 (ref 9–23)
BUN: 10 mg/dL (ref 6–24)
Bilirubin Total: 0.2 mg/dL (ref 0.0–1.2)
CO2: 23 mmol/L (ref 20–29)
Calcium: 9.4 mg/dL (ref 8.7–10.2)
Chloride: 103 mmol/L (ref 96–106)
Creatinine, Ser: 0.84 mg/dL (ref 0.57–1.00)
GFR calc Af Amer: 94 mL/min/{1.73_m2} (ref >59)
GFR calc non Af Amer: 81 mL/min/{1.73_m2} (ref >59)
Globulin, Total: 3.3 g/dL (ref 1.5–4.5)
Glucose: 91 mg/dL (ref 65–99)
Potassium: 4.8 mmol/L (ref 3.5–5.2)
Sodium: 139 mmol/L (ref 134–144)
Total Protein: 7.7 g/dL (ref 6.0–8.5)

## 2019-11-21 LAB — CBC WITH DIFFERENTIAL/PLATELET
Basophils Absolute: 0.1 10*3/uL (ref 0.0–0.2)
Basos: 1 %
EOS (ABSOLUTE): 0.1 10*3/uL (ref 0.0–0.4)
Eos: 2 %
Hematocrit: 41.7 % (ref 34.0–46.6)
Hemoglobin: 13.4 g/dL (ref 11.1–15.9)
Immature Grans (Abs): 0 10*3/uL (ref 0.0–0.1)
Immature Granulocytes: 1 %
Lymphocytes Absolute: 1.8 10*3/uL (ref 0.7–3.1)
Lymphs: 28 %
MCH: 28.7 pg (ref 26.6–33.0)
MCHC: 32.1 g/dL (ref 31.5–35.7)
MCV: 89 fL (ref 79–97)
Monocytes Absolute: 0.5 10*3/uL (ref 0.1–0.9)
Monocytes: 7 %
Neutrophils Absolute: 4 10*3/uL (ref 1.4–7.0)
Neutrophils: 61 %
Platelets: 407 10*3/uL (ref 150–450)
RBC: 4.67 x10E6/uL (ref 3.77–5.28)
RDW: 12.6 % (ref 11.7–15.4)
WBC: 6.4 10*3/uL (ref 3.4–10.8)

## 2019-11-21 LAB — VITAMIN D 25 HYDROXY (VIT D DEFICIENCY, FRACTURES): Vit D, 25-Hydroxy: 22.4 ng/mL — ABNORMAL LOW (ref 30.0–100.0)

## 2019-11-21 LAB — LIPID PANEL W/O CHOL/HDL RATIO
Cholesterol, Total: 200 mg/dL — ABNORMAL HIGH (ref 100–199)
HDL: 60 mg/dL (ref >39)
LDL Chol Calc (NIH): 123 mg/dL — ABNORMAL HIGH (ref 0–99)
Triglycerides: 94 mg/dL (ref 0–149)
VLDL Cholesterol Cal: 17 mg/dL (ref 5–40)

## 2019-11-21 LAB — TSH: TSH: 2.18 u[IU]/mL (ref 0.450–4.500)

## 2019-11-23 LAB — CYTOLOGY - PAP
Comment: NEGATIVE
High risk HPV: POSITIVE — AB

## 2019-11-26 ENCOUNTER — Telehealth: Payer: Self-pay | Admitting: Nurse Practitioner

## 2019-11-26 DIAGNOSIS — R87622 Low grade squamous intraepithelial lesion on cytologic smear of vagina (LGSIL): Secondary | ICD-10-CM

## 2019-11-26 NOTE — Telephone Encounter (Signed)
Discussed pap results with patient - will refer to gynecology to have further evaluation.  Patient agreeable and all questions answered.

## 2019-11-27 ENCOUNTER — Telehealth: Payer: Self-pay | Admitting: Obstetrics & Gynecology

## 2019-11-27 NOTE — Telephone Encounter (Signed)
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 Called and left voicemail for patient to call back to be schedule

## 2019-11-28 NOTE — Telephone Encounter (Signed)
Called and left voice mail for patient to call back to be schedule °

## 2019-11-29 ENCOUNTER — Other Ambulatory Visit: Payer: Self-pay

## 2019-11-29 ENCOUNTER — Encounter: Payer: Self-pay | Admitting: Gastroenterology

## 2019-12-03 NOTE — Telephone Encounter (Signed)
Unable to reach patient to get patient schedule. Contacted PCP

## 2019-12-04 ENCOUNTER — Other Ambulatory Visit
Admission: RE | Admit: 2019-12-04 | Discharge: 2019-12-04 | Disposition: A | Discharge status: Home / Self Care | Payer: No Typology Code available for payment source | Source: Ambulatory Visit | Attending: Gastroenterology | Admitting: Gastroenterology

## 2019-12-04 DIAGNOSIS — Z20822 Contact with and (suspected) exposure to covid-19: Secondary | ICD-10-CM | POA: Insufficient documentation

## 2019-12-04 DIAGNOSIS — Z01812 Encounter for preprocedural laboratory examination: Secondary | ICD-10-CM | POA: Insufficient documentation

## 2019-12-04 LAB — SARS CORONAVIRUS 2 (TAT 6-24 HRS): SARS Coronavirus 2: NEGATIVE

## 2019-12-05 NOTE — Discharge Instructions (Signed)
General Anesthesia, Adult, Care After This sheet gives you information about how to care for yourself after your procedure. Your health care provider may also give you more specific instructions. If you have problems or questions, contact your health care provider. What can I expect after the procedure? After the procedure, the following side effects are common:  Pain or discomfort at the IV site.  Nausea.  Vomiting.  Sore throat.  Trouble concentrating.  Feeling cold or chills.  Weak or tired.  Sleepiness and fatigue.  Soreness and body aches. These side effects can affect parts of the body that were not involved in surgery. Follow these instructions at home:  For at least 24 hours after the procedure:  Have a responsible adult stay with you. It is important to have someone help care for you until you are awake and alert.  Rest as needed.  Do not: ? Participate in activities in which you could fall or become injured. ? Drive. ? Use heavy machinery. ? Drink alcohol. ? Take sleeping pills or medicines that cause drowsiness. ? Make important decisions or sign legal documents. ? Take care of children on your own. Eating and drinking  Follow any instructions from your health care provider about eating or drinking restrictions.  When you feel hungry, start by eating small amounts of foods that are soft and easy to digest (bland), such as toast. Gradually return to your regular diet.  Drink enough fluid to keep your urine pale yellow.  If you vomit, rehydrate by drinking water, juice, or clear broth. General instructions  If you have sleep apnea, surgery and certain medicines can increase your risk for breathing problems. Follow instructions from your health care provider about wearing your sleep device: ? Anytime you are sleeping, including during daytime naps. ? While taking prescription pain medicines, sleeping medicines, or medicines that make you drowsy.  Return to  your normal activities as told by your health care provider. Ask your health care provider what activities are safe for you.  Take over-the-counter and prescription medicines only as told by your health care provider.  If you smoke, do not smoke without supervision.  Keep all follow-up visits as told by your health care provider. This is important. Contact a health care provider if:  You have nausea or vomiting that does not get better with medicine.  You cannot eat or drink without vomiting.  You have pain that does not get better with medicine.  You are unable to pass urine.  You develop a skin rash.  You have a fever.  You have redness around your IV site that gets worse. Get help right away if:  You have difficulty breathing.  You have chest pain.  You have blood in your urine or stool, or you vomit blood. Summary  After the procedure, it is common to have a sore throat or nausea. It is also common to feel tired.  Have a responsible adult stay with you for the first 24 hours after general anesthesia. It is important to have someone help care for you until you are awake and alert.  When you feel hungry, start by eating small amounts of foods that are soft and easy to digest (bland), such as toast. Gradually return to your regular diet.  Drink enough fluid to keep your urine pale yellow.  Return to your normal activities as told by your health care provider. Ask your health care provider what activities are safe for you. This information is not   intended to replace advice given to you by your health care provider. Make sure you discuss any questions you have with your health care provider. Document Revised: 08/12/2017 Document Reviewed: 03/25/2017 Elsevier Patient Education  2020 [employer]vier Inc[employer]  

## 2019-12-06 ENCOUNTER — Ambulatory Visit: Payer: PRIVATE HEALTH INSURANCE | Admitting: Anesthesiology

## 2019-12-06 ENCOUNTER — Encounter: Payer: Self-pay | Admitting: Gastroenterology

## 2019-12-06 ENCOUNTER — Ambulatory Visit
Admission: RE | Admit: 2019-12-06 | Discharge: 2019-12-06 | Disposition: A | Discharge status: Home / Self Care | Payer: PRIVATE HEALTH INSURANCE | Attending: Gastroenterology | Admitting: Gastroenterology

## 2019-12-06 ENCOUNTER — Other Ambulatory Visit: Payer: Self-pay

## 2019-12-06 ENCOUNTER — Encounter
Admission: RE | Disposition: A | Discharge status: Home / Self Care | Payer: Self-pay | Source: Home / Self Care | Attending: Gastroenterology

## 2019-12-06 DIAGNOSIS — K5730 Diverticulosis of large intestine without perforation or abscess without bleeding: Secondary | ICD-10-CM

## 2019-12-06 DIAGNOSIS — Z79899 Other long term (current) drug therapy: Secondary | ICD-10-CM | POA: Diagnosis not present

## 2019-12-06 DIAGNOSIS — M545 Low back pain: Secondary | ICD-10-CM | POA: Diagnosis not present

## 2019-12-06 DIAGNOSIS — G8929 Other chronic pain: Secondary | ICD-10-CM | POA: Diagnosis not present

## 2019-12-06 DIAGNOSIS — Z1211 Encounter for screening for malignant neoplasm of colon: Secondary | ICD-10-CM | POA: Diagnosis present

## 2019-12-06 DIAGNOSIS — Z87891 Personal history of nicotine dependence: Secondary | ICD-10-CM | POA: Diagnosis not present

## 2019-12-06 DIAGNOSIS — K635 Polyp of colon: Secondary | ICD-10-CM | POA: Diagnosis not present

## 2019-12-06 HISTORY — PX: POLYPECTOMY: SHX5525

## 2019-12-06 HISTORY — PX: COLONOSCOPY WITH PROPOFOL: SHX5780

## 2019-12-06 SURGERY — COLONOSCOPY WITH PROPOFOL
Anesthesia: General | Site: Rectum

## 2019-12-06 MED ORDER — LACTATED RINGERS IV SOLN: INTRAVENOUS | Status: DC

## 2019-12-06 MED ORDER — LIDOCAINE HCL (CARDIAC) PF 100 MG/5ML IV SOSY
PREFILLED_SYRINGE | INTRAVENOUS | Status: DC | PRN
  Administered 2019-12-06: 20 mg via INTRAVENOUS

## 2019-12-06 MED ORDER — STERILE WATER FOR IRRIGATION IR SOLN
Status: DC | PRN
  Administered 2019-12-06: 50 mL

## 2019-12-06 MED ORDER — PROPOFOL 10 MG/ML IV BOLUS
INTRAVENOUS | Status: DC | PRN
  Administered 2019-12-06 (×2): 20 mg via INTRAVENOUS
  Administered 2019-12-06: 80 mg via INTRAVENOUS
  Administered 2019-12-06: 30 mg via INTRAVENOUS
  Administered 2019-12-06: 20 mg via INTRAVENOUS
  Administered 2019-12-06 (×3): 30 mg via INTRAVENOUS

## 2019-12-06 SURGICAL SUPPLY — 8 items
CANISTER SUCT 1200ML W/VALVE (MISCELLANEOUS) ×3 IMPLANT
GOWN CVR UNV OPN BCK APRN NK (MISCELLANEOUS) ×2 IMPLANT
GOWN ISOL THUMB LOOP REG UNIV (MISCELLANEOUS) ×4
KIT ENDO PROCEDURE OLY (KITS) ×3 IMPLANT
MANIFOLD 4PT FOR NEPTUNE1 (MISCELLANEOUS) ×2 IMPLANT
SNARE COLD EXACTO (MISCELLANEOUS) ×2 IMPLANT
TRAP ETRAP POLY (MISCELLANEOUS) ×2 IMPLANT
WATER STERILE IRR 250ML POUR (IV SOLUTION) ×3 IMPLANT

## 2019-12-06 NOTE — Anesthesia Preprocedure Evaluation (Signed)
Anesthesia Evaluation  Patient identified by MRN, date of birth, ID band Patient awake    Reviewed: Allergy & Precautions, NPO status , Patient's Chart, lab work & pertinent test results  History of Anesthesia Complications Negative for: history of anesthetic complications  Airway Mallampati: II  TM Distance: >3 FB Neck ROM: Full    Dental   Pulmonary former smoker,    Pulmonary exam normal        Cardiovascular negative cardio ROS Normal cardiovascular exam     Neuro/Psych negative neurological ROS     GI/Hepatic negative GI ROS,   Endo/Other    Renal/GU      Musculoskeletal   Abdominal   Peds  Hematology   Anesthesia Other Findings   Reproductive/Obstetrics                             Anesthesia Physical Anesthesia Plan  ASA: II  Anesthesia Plan: General   Post-op Pain Management:    Induction: Intravenous  PONV Risk Score and Plan: 2 and Ondansetron and TIVA  Airway Management Planned: Natural Airway  Additional Equipment:   Intra-op Plan:   Post-operative Plan:   Informed Consent: I have reviewed the patients History and Physical, chart, labs and discussed the procedure including the risks, benefits and alternatives for the proposed anesthesia with the patient or authorized representative who has indicated his/her understanding and acceptance.       Plan Discussed with: CRNA, Anesthesiologist and Surgeon  Anesthesia Plan Comments:         Anesthesia Quick Evaluation

## 2019-12-06 NOTE — Transfer of Care (Signed)
Immediate Anesthesia Transfer of Care Note  Patient: Renee Parsons  Procedure(s) Performed: COLONOSCOPY WITH BIOPSY (N/A Rectum) POLYPECTOMY (N/A Rectum)  Patient Location: PACU  Anesthesia Type: General  Level of Consciousness: awake, alert  and patient cooperative  Airway and Oxygen Therapy: Patient Spontanous Breathing and Patient connected to supplemental oxygen  Post-op Assessment: Post-op Vital signs reviewed, Patient's Cardiovascular Status Stable, Respiratory Function Stable, Patent Airway and No signs of Nausea or vomiting  Post-op Vital Signs: Reviewed and stable  Complications: No apparent anesthesia complications

## 2019-12-06 NOTE — H&P (Signed)
Arlyss Repress, MD [address]  [address]  [place], [place] [address]  Main: [phone]  Fax: [phone] Pager: [phone]  Primary Care Physician:  Dorcas Carrow, DO Primary Gastroenterologist:  Dr. Arlyss Repress  Pre-Procedure History & Physical: HPI:  Renee Parsons is a 49 y.o. female is here for an colonoscopy.   Past Medical History:  Diagnosis Date  . Chronic back pain     Past Surgical History:  Procedure Laterality Date  . ABDOMINAL HYSTERECTOMY      Prior to Admission medications   Medication Sig Start Date End Date Taking? Authorizing Provider  Cholecalciferol (VITAMIN D3 PO) Take by mouth daily.   Yes [provider]  ELDERBERRY PO Take by mouth daily.   Yes [provider]  loratadine (CLARITIN) 10 MG tablet Take 1 tablet (10 mg total) by mouth daily. 10/30/19  Yes Mardene Celeste I, NP  montelukast (SINGULAIR) 10 MG tablet Take 1 tablet (10 mg total) by mouth at bedtime. 10/30/19  Yes Mardene Celeste I, NP  Naproxen Sodium (ALEVE) 220 MG CAPS Take 220 mg by mouth daily.   Yes [provider]  tiZANidine (ZANAFLEX) 4 MG tablet Take 1 tablet (4 mg total) by mouth every 6 (six) hours as needed for muscle spasms. 10/30/19  Yes Mardene Celeste I, NP    Allergies as of 11/01/2019  . (No Known Allergies)    Family History  Problem Relation Age of Onset  . Migraines Mother   . Diabetes Maternal Grandmother     Social History   Socioeconomic History  . Marital status: Married    Spouse name: Not on file  . Number of children: Not on file  . Years of education: Not on file  . Highest education level: Not on file  Occupational History  . Not on file  Tobacco Use  . Smoking status: Former Smoker    Quit date: 08/23/2016    Years since quitting: 3.2  . Smokeless tobacco: Never Used  Substance and Sexual Activity  . Alcohol use: Yes    Alcohol/week: 4.0 standard drinks    Types: 4 Glasses of wine per week  . Drug use:  No  . Sexual activity: Yes    Birth control/protection: Surgical  Other Topics Concern  . Not on file  Social History Narrative  . Not on file   Social Determinants of Health   Financial Resource Strain:   . Difficulty of Paying Living Expenses:   Food Insecurity:   . Worried About Programme researcher, broadcasting/film/video in the Last Year:   . Barista in the Last Year:   Transportation Needs:   . Freight forwarder (Medical):   Marland Kitchen Lack of Transportation (Non-Medical):   Physical Activity:   . Days of Exercise per Week:   . Minutes of Exercise per Session:   Stress:   . Feeling of Stress :   Social Connections:   . Frequency of Communication with Friends and Family:   . Frequency of Social Gatherings with Friends and Family:   . Attends Religious Services:   . Active Member of Clubs or Organizations:   . Attends Banker Meetings:   Marland Kitchen Marital Status:   Intimate Partner Violence:   . Fear of Current or Ex-Partner:   . Emotionally Abused:   Marland Kitchen Physically Abused:   . Sexually Abused:     Review of Systems: See HPI, otherwise negative ROS  Physical Exam: BP 119/84  Pulse 75   Temp (!) 97.5 F (36.4 C) (Temporal)   Resp 16   Ht 5\' 6"  (1.676 m)   Wt 87.5 kg   SpO2 98%   BMI 31.15 kg/m  General:   Alert,  pleasant and cooperative in NAD Head:  Normocephalic and atraumatic. Neck:  Supple; no masses or thyromegaly. Lungs:  Clear throughout to auscultation.    Heart:  Regular rate and rhythm. Abdomen:  Soft, nontender and nondistended. Normal bowel sounds, without guarding, and without rebound.   Neurologic:  Alert and  oriented x4;  grossly normal neurologically.  Impression/Plan: Renee Parsons is here for an colonoscopy to be performed for colon cancer screening  Risks, benefits, limitations, and alternatives regarding  colonoscopy have been reviewed with the patient.  Questions have been answered.  All parties agreeable.   Sherri Sear, MD  12/06/2019,  8:07 AM

## 2019-12-06 NOTE — Op Note (Signed)
[facility] Patient Name: Renee Parsons Procedure Date: 12/06/2019 8:43 AM MRN: [id number] Account #: [id number] Date of Birth: [birth year] Admit Type: Outpatient Age: 49 Room: [facility] 2 OR ROOM 01 Gender: Female Note Status: Finalized Procedure:             Colonoscopy Indications:           Screening for colorectal malignant neoplasm, This is                         the patient's first colonoscopy Providers:             Lin Landsman MD, MD Referring MD:          Valerie Roys (Referring MD) Medicines:             Monitored Anesthesia Care Complications:         No immediate complications. Estimated blood loss: None. Procedure:             Pre-Anesthesia Assessment:                        - Prior to the procedure, a History and Physical was                         performed, and patient medications and allergies were                         reviewed. The patient is competent. The risks and                         benefits of the procedure and the sedation options and                         risks were discussed with the patient. All questions                         were answered and informed consent was obtained.                         Patient identification and proposed procedure were                         verified by the physician, the nurse, the                         anesthesiologist, the anesthetist and the technician                         in the pre-procedure area in the procedure room in the                         endoscopy suite. Mental Status Examination: alert and                         oriented. Airway Examination: normal oropharyngeal                         airway and neck mobility. Respiratory Examination:  clear to auscultation. CV Examination: normal.                         Prophylactic Antibiotics: The patient does not require                         prophylactic antibiotics. Prior  Anticoagulants: The                         patient has taken no previous anticoagulant or                         antiplatelet agents. ASA Grade Assessment: II - A                         patient with mild systemic disease. After reviewing                         the risks and benefits, the patient was deemed in                         satisfactory condition to undergo the procedure. The                         anesthesia plan was to use monitored anesthesia care                         (MAC). Immediately prior to administration of                         medications, the patient was re-assessed for adequacy                         to receive sedatives. The heart rate, respiratory                         rate, oxygen saturations, blood pressure, adequacy of                         pulmonary ventilation, and response to care were                         monitored throughout the procedure. The physical                         status of the patient was re-assessed after the                         procedure.                        After obtaining informed consent, the colonoscope was                         passed under direct vision. Throughout the procedure,                         the patient's blood pressure, pulse, and oxygen  saturations were monitored continuously. The was                         introduced through the anus and advanced to the the                         cecum, identified by appendiceal orifice and ileocecal                         valve. The colonoscopy was performed without                         difficulty. The patient tolerated the procedure well.                         The quality of the bowel preparation was evaluated                         using the BBPS [facility] Bowel Preparation Scale) with                         scores of: Right Colon = 3, Transverse Colon = 3 and                         Left Colon = 3 (entire mucosa seen well with  no                         residual staining, small fragments of stool or opaque                         liquid). The total BBPS score equals 9. Findings:      The perianal and digital rectal examinations were normal. Pertinent       negatives include normal sphincter tone and no palpable rectal lesions.      A 4 mm polyp was found in the descending colon. The polyp was sessile.       The polyp was removed with a cold snare. Resection and retrieval were       complete.      Multiple diverticula were found in the sigmoid colon.      The retroflexed view of the distal rectum and anal verge was normal and       showed no anal or rectal abnormalities.      The exam was otherwise without abnormality. Impression:            - One 4 mm polyp in the descending colon, removed with                         a cold snare. Resected and retrieved.                        - Diverticulosis in the sigmoid colon.                        - The distal rectum and anal verge are normal on                         retroflexion view.                        -  The examination was otherwise normal. Recommendation:        - Discharge patient to home (with escort).                        - Resume previous diet today.                        - Continue present medications.                        - Await pathology results.                        - Repeat colonoscopy in 7-10 years for surveillance                         based on pathology results. Procedure Code(s):     --- Professional ---                        [phone], Colonoscopy, flexible; with removal of                         tumor(s), polyp(s), or other lesion(s) by snare                         technique Diagnosis Code(s):     --- Professional ---                        Z12.11, Encounter for screening for malignant neoplasm                         of colon                        K63.5, Polyp of colon                        K57.30, Diverticulosis of large  intestine without                         perforation or abscess without bleeding CPT copyright 2019 American Medical Association. All rights reserved. The codes documented in this report are preliminary and upon coder review may  be revised to meet current compliance requirements. Dr. Ulyess Mort Lin Landsman MD, MD 12/06/2019 9:03:25 AM This report has been signed electronically. Number of Addenda: 0 Note Initiated On: 12/06/2019 8:43 AM Scope Withdrawal Time: 0 hours 9 minutes 9 seconds  Total Procedure Duration: 0 hours 11 minutes 34 seconds  Estimated Blood Loss:  Estimated blood loss: none.      [facility]

## 2019-12-06 NOTE — Anesthesia Postprocedure Evaluation (Signed)
Anesthesia Post Note  Patient: ANAYANSI RUNDQUIST  Procedure(s) Performed: COLONOSCOPY WITH BIOPSY (N/A Rectum) POLYPECTOMY (N/A Rectum)     Patient location during evaluation: PACU Anesthesia Type: General Level of consciousness: awake and alert Pain management: pain level controlled Vital Signs Assessment: post-procedure vital signs reviewed and stable Respiratory status: spontaneous breathing, nonlabored ventilation, respiratory function stable and patient connected to nasal cannula oxygen Cardiovascular status: stable and blood pressure returned to baseline Postop Assessment: no apparent nausea or vomiting Anesthetic complications: no    Gayland Curry Neena Beecham

## 2019-12-07 ENCOUNTER — Encounter: Payer: Self-pay | Admitting: *Deleted

## 2019-12-10 ENCOUNTER — Encounter: Payer: Self-pay | Admitting: Gastroenterology

## 2019-12-10 LAB — SURGICAL PATHOLOGY

## 2019-12-11 ENCOUNTER — Ambulatory Visit (INDEPENDENT_AMBULATORY_CARE_PROVIDER_SITE_OTHER): Payer: PRIVATE HEALTH INSURANCE | Admitting: Family Medicine

## 2019-12-11 ENCOUNTER — Other Ambulatory Visit: Payer: Self-pay

## 2019-12-11 ENCOUNTER — Encounter: Payer: Self-pay | Admitting: Family Medicine

## 2019-12-11 VITALS — BP 121/88 | HR 105 | Temp 99.2°F | Wt 195.0 lb

## 2019-12-11 DIAGNOSIS — L600 Ingrowing nail: Secondary | ICD-10-CM

## 2019-12-11 NOTE — Assessment & Plan Note (Signed)
Removed today as below.

## 2019-12-11 NOTE — Patient Instructions (Signed)
Fingernail or Toenail Removal, Adult, Care After °This sheet gives you information about how to care for yourself after your procedure. Your health care provider may also give you more specific instructions. If you have problems or questions, contact your health care provider. °What can I expect after the procedure? °After the procedure, it is common to have: °· Pain. °· Redness. °· Swelling. °· Soreness. °Follow these instructions at home: °Medicines °· Take over-the-counter and prescription medicines only as told by your health care provider. °· If you were prescribed an antibiotic medicine, take or apply it as told by your health care provider. Do not stop using the antibiotic even if you start to feel better. °Wound care °· Follow instructions from your health care provider about how to take care of your wound. Make sure you: °? Wash your hands with soap and water for at least 20 seconds before and after you change your bandage (dressing). If soap and water are not available, use hand sanitizer. °? Change your dressing as told by your health care provider. °? Keep your dressing dry until your health care provider says it can be removed. °· Check your wound every day for signs of infection. Check for: °? More redness, swelling, or pain. °? More fluid or blood. °? Warmth. °? Pus or a bad smell. °If you have a splint: ° °· Wear the splint as told by your health care provider. Remove it only as told by your health care provider. °· Loosen the splint if your fingers tingle, become numb, or turn cold and blue. °· Keep the splint clean. °· If the splint is not waterproof: °? Do not let it get wet. °? Cover it with a watertight covering when you take a bath or a shower. °Managing pain, stiffness, and swelling °· Move your fingers or toes often to reduce stiffness and swelling. °· Raise (elevate) the injured area above the level of your heart while you are sitting or lying down. You may need to keep your hand or foot  raised or supported on a pillow for 24 hours or as told by your health care provider. °General instructions °· If you were given a shoe to wear, wear it as told by your health care provider. °· Keep all follow-up visits as told by your health care provider. This is important. °Contact a health care provider if: °· You have more redness, swelling, or pain around your wound. °· You have more fluid or blood coming from your wound. °· You have pus or a bad smell coming from your wound. °· Your wound feels warm to the touch. °· You have a fever. °Get help right away if: °· Your finger or toe looks pale, blue, or black. °· You are not able to move your finger or toe. °Summary °· After the procedure, it is common to have pain and swelling. °· Keep the hand or foot raised (elevated) or supported on a pillow as told by your health care provider. °· Take over-the-counter and prescription medicines only as told by your health care provider. °· Check your wound every day for signs of infection. °This information is not intended to replace advice given to you by your health care provider. Make sure you discuss any questions you have with your health care provider. °Document Revised: 04/02/2019 Document Reviewed: 04/02/2019 °Elsevier Patient Education © 2020 [employer]. ° °

## 2019-12-11 NOTE — Progress Notes (Signed)
BP 121/88 (BP Location: Right Arm, Patient Position: Sitting, Cuff Size: Normal)   Pulse (!) 105   Temp 99.2 F (37.3 C) (Oral)   Wt 195 lb (88.5 kg)   SpO2 97%   BMI 31.47 kg/m    Subjective:    Patient ID: Skip Estimable, female    DOB: 02-10-1971, 49 y.o.   MRN: [id number]  HPI: Renee Parsons is a 49 y.o. female  Chief Complaint  Patient presents with  . Ingrown Toenail   TOE PAIN Duration: chronic Involved toe: bilateralbig toe  Mechanism of injury: unknown Onset: gradual Severity: mild  Quality: aching and sore Frequency: constant Radiation: no Aggravating factors:tight shoes   Alleviating factors:nothing   Status: stable Treatments attempted:nothing   Relief with NSAIDs?: No NSAIDs Taken Morning stiffness: no Redness: no  Bruising: no Swelling: no Paresthesias / decreased sensation: no Fevers: no  Relevant past medical, surgical, family and social history reviewed and updated as indicated. Interim medical history since our last visit reviewed. Allergies and medications reviewed and updated.  Review of Systems  Constitutional: Negative.   Respiratory: Negative.   Cardiovascular: Negative.   Gastrointestinal: Negative.   Musculoskeletal: Negative.   Neurological: Negative.   Psychiatric/Behavioral: Negative.     Per HPI unless specifically indicated above     Objective:    BP 121/88 (BP Location: Right Arm, Patient Position: Sitting, Cuff Size: Normal)   Pulse (!) 105   Temp 99.2 F (37.3 C) (Oral)   Wt 195 lb (88.5 kg)   SpO2 97%   BMI 31.47 kg/m   Wt Readings from Last 3 Encounters:  12/11/19 195 lb (88.5 kg)  12/06/19 193 lb (87.5 kg)  11/20/19 196 lb 3.2 oz (89 kg)    Physical Exam Vitals and nursing note reviewed.  Constitutional:      General: She is not in acute distress.    Appearance: Normal appearance. She is not ill-appearing, toxic-appearing or diaphoretic.  HENT:     Head: Normocephalic and atraumatic.     Right  Ear: External ear normal.     Left Ear: External ear normal.     Nose: Nose normal.     Mouth/Throat:     Mouth: Mucous membranes are moist.     Pharynx: Oropharynx is clear.  Eyes:     General: No scleral icterus.       Right eye: No discharge.        Left eye: No discharge.     Extraocular Movements: Extraocular movements intact.     Conjunctiva/sclera: Conjunctivae normal.     Pupils: Pupils are equal, round, and reactive to light.  Cardiovascular:     Rate and Rhythm: Normal rate and regular rhythm.     Pulses: Normal pulses.     Heart sounds: Normal heart sounds. No murmur. No friction rub. No gallop.   Pulmonary:     Effort: Pulmonary effort is normal. No respiratory distress.     Breath sounds: Normal breath sounds. No stridor. No wheezing, rhonchi or rales.  Chest:     Chest wall: No tenderness.  Musculoskeletal:        General: Normal range of motion.     Cervical back: Normal range of motion and neck supple.  Skin:    General: Skin is warm and dry.     Capillary Refill: Capillary refill takes less than 2 seconds.     Coloration: Skin is not jaundiced or pale.     Findings: No  bruising, erythema, lesion or rash.     Comments: Ingrown toenail on the L lateral border, no infection  Neurological:     General: No focal deficit present.     Mental Status: She is alert and oriented to person, place, and time. Mental status is at baseline.  Psychiatric:        Mood and Affect: Mood normal.        Behavior: Behavior normal.        Thought Content: Thought content normal.        Judgment: Judgment normal.     Results for orders placed or performed during the hospital encounter of 12/06/19  Surgical pathology  Result Value Ref Range   SURGICAL PATHOLOGY      SURGICAL PATHOLOGY CASE: [id number] PATIENT: Jadene Pierini Surgical Pathology Report     Specimen Submitted: A. Colon polyp, descending; cold snare  Clinical History: Screening colonoscopy.  Colon  polyp; sigmoid diverticulosis    DIAGNOSIS: A. COLON POLYP, DESCENDING; COLD SNARE: - BENIGN COLONIC MUCOSA WITH PROMINENT LYMPHOID AGGREGATE AND SUPERFICIAL REACTIVE CHANGES. - NEGATIVE FOR DYSPLASIA AND MALIGNANCY.  GROSS DESCRIPTION: A. Labeled: Descending colon polyp x1 cold snare Received: Formalin Tissue fragment(s): 2 Size: 0.3-1.1 cm Description: Tan soft tissue fragments Entirely submitted in 1 cassette.     Final Diagnosis performed by Katherine Mantle, MD.   Electronically signed 12/10/2019 8:49:22AM The electronic signature indicates that the named Attending Pathologist has evaluated the specimen Technical component performed at [facility], [address] [place], [place] [address] Lab: [phone] Dir: Jolene Schimke, MD, MMM  Professional componen t performed at [facility], [facility], [address] [place], [place], [place] [address] Lab: [phone] Dir: Georgiann Cocker. Oneita Kras, MD       Assessment & Plan:   Problem List Items Addressed This Visit      Musculoskeletal and Integument   Ingrown nail - Primary    Removed today as below.          Procedure: Partial Toenail removal with Matrix Diagnosis:    ICD-10-CM   1. Ingrown nail  L60.0    Physican: Olevia Perches, DO Consent: Risks, benefits, and alternative treatments discussed and all questions were answered.  Patient elected to proceed and verbal consent obtained Description:  Area prepped and draped using  sterile technique. Digital block of the  Left  1st toe performed by injecting local anesthetic at the base of the toe at the 2 oclock and 10 oclock positions, using 9 cc's of  1% lidocaine plain. After confirming adequate anesthesia, lateral nail folds and epinychia were freed up using periosteal elevator.  Using scissors the nail was vertically cut beyond the epinychia to the base.  A hemostat was then used to remove the nail fragment. The nail was grasped using a hemostat and the nail was removed  intact. Phenol was applied to the nail matrix x 3 using a cotton applicator tip.   Bacitracin ointment was applied to the operative site a circumferential gauze dressive was applied.  The patient tolerated the procedure well.  Complications: none Estimated Blood Loss: minimal Post Procedure Instructions: The patient was encouraged to keep the dressing in place for 24 hours and keep the foot elevated as much as possible during this time.  After the first day they are instructed to soak the toe in warm water 3 times daily for 3-4 days.  Antibiotic ointment is to be applied daily for 1 week.  The patient was informed that some oozing is to  be expected for 1-2 weeks but that they should return immediately for pus, increased pain or redness.  They were instructed to take APAP or motrin as needed for post operative discomfort.   Follow up plan: Return if symptoms worsen or fail to improve.

## 2019-12-24 ENCOUNTER — Ambulatory Visit: Payer: PRIVATE HEALTH INSURANCE | Admitting: Family Medicine

## 2020-01-04 ENCOUNTER — Ambulatory Visit: Payer: PRIVATE HEALTH INSURANCE | Admitting: Family Medicine

## 2020-01-17 ENCOUNTER — Ambulatory Visit: Payer: PRIVATE HEALTH INSURANCE | Admitting: Family Medicine

## 2020-02-12 ENCOUNTER — Other Ambulatory Visit: Payer: Self-pay | Admitting: Nurse Practitioner

## 2020-02-12 DIAGNOSIS — R87622 Low grade squamous intraepithelial lesion on cytologic smear of vagina (LGSIL): Secondary | ICD-10-CM

## 2020-02-13 ENCOUNTER — Telehealth: Payer: Self-pay | Admitting: Obstetrics & Gynecology

## 2020-02-13 NOTE — Telephone Encounter (Signed)
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 Called and left voicemail for patient to call back to be scheduled.

## 2020-02-14 NOTE — Telephone Encounter (Signed)
Phone number on file not accepting calls at this time 

## 2020-02-15 NOTE — Telephone Encounter (Signed)
Called and left voicemail for patient to call back to be scheduled. 

## 2020-02-18 NOTE — Telephone Encounter (Signed)
Called and left voicemail for patient to call back to be scheduled. 

## 2020-02-19 ENCOUNTER — Telehealth: Payer: Self-pay | Admitting: Nurse Practitioner

## 2020-02-19 NOTE — Telephone Encounter (Signed)
Called and left message per DPR regarding GYN referral - GYN has not been able to schedule appointment with patient.  Need to confirm that patient would still like further work-up for + pap in March.

## 2020-03-19 ENCOUNTER — Ambulatory Visit (INDEPENDENT_AMBULATORY_CARE_PROVIDER_SITE_OTHER): Payer: No Typology Code available for payment source | Admitting: Obstetrics and Gynecology

## 2020-03-19 ENCOUNTER — Encounter: Payer: Self-pay | Admitting: Obstetrics and Gynecology

## 2020-03-19 ENCOUNTER — Other Ambulatory Visit: Payer: Self-pay

## 2020-03-19 DIAGNOSIS — R87622 Low grade squamous intraepithelial lesion on cytologic smear of vagina (LGSIL): Secondary | ICD-10-CM

## 2020-03-19 NOTE — Progress Notes (Signed)
   GYNECOLOGY CLINIC COLPOSCOPY PROCEDURE NOTE  49 y.o. No obstetric history on file. here for colposcopy for low-grade squamous intraepithelial neoplasia (LGSIL - encompassing HPV,mild dysplasia,CIN I)  pap smear on 11/20/2019. Discussed underlying role for HPV infection in the development of cervical dysplasia, its natural history and progression/regression, need for surveillance.  Is the patient  pregnant: No LMP: No LMP recorded. Patient has had a hysterectomy. Smoking status:  reports that she quit smoking about 3 years ago. She has never used smokeless tobacco. Contraception: none Future fertility desired:  No  Patient given informed consent, signed copy in the chart, time out was performed.  The patient was position in dorsal lithotomy position. Speculum was placed the cervix was visualized.   After application of acetic acid colposcopic inspection of the cervix was undertaken.   Colposcopy adequate, full visualization of vaginal cuff: Yes no visible lesions; corresponding biopsies obtained.   ECC specimen obtained:  No  All specimens were labeled and sent to pathology.   Patient was given post procedure instructions.  Will follow up pathology and manage accordingly.  Routine preventative health maintenance measures emphasized.  Physical Exam Genitourinary:     Genitourinary Comments: Normal vaginal cuff. No aceto white changes. No vessels. No highlighting with Lugols solution.     Recommended repeat pap smear in 1 year.  Continued monitoring.  Discussed that she no longer has a cervix.  Reports hysterectomy was in 2007 for fibroids and heavy menstrual bleeding.   Adelene Idler MD [facility], [facility] 03/19/2020 10:07 AM

## 2020-03-19 NOTE — Patient Instructions (Signed)
Vaginal Cancer  Vaginal cancer is a type of cancer that develops in the vagina, or birth canal. There are several types of vaginal cancer. Squamous cell carcinoma is the most common type. It begins in the thin, flat cells that line the vagina. It spreads slowly and usually stays near the vagina. In some cases, it may spread to the lungs, liver, or bones. A less common type of vaginal cancer is adenocarcinoma. This is a type of cancer that begins in cells that make fluids such as mucus. What are the causes? The exact cause of this condition is not known. What increases the risk? The following factors may make you more likely to develop this condition:  Being infected with certain types of HPV (human papillomavirus).  Smoking.  Having been exposed to the drug diethylstilbestrol (DES) before birth. Between 1940 and 1971, this drug was given to some pregnant women to keep their babies from being born too early.  Being 50 years of age or older. Females age 13 or older have the highest risk.  Having a history of abnormal cells in the cervix or uterus.  Having had cervical or uterine cancer.  Having HIV (human immunodeficiency virus). What are the signs or symptoms? Symptoms of this condition include:  Vaginal bleeding or discharge that is not caused by a menstrual period.  Pain during sex or bleeding after sex.  Pain in the pelvic area.  A lump in the vagina.  Pain during urination.  Constipation lasting several weeks. How is this diagnosed? This condition may be diagnosed based on your medical history and a physical exam. The physical exam will include a pelvic exam to check the vagina, cervix, uterus, fallopian tubes, ovaries, and rectum. You may also have tests, including:  A Pap test. This test involves collecting cells from the surface of the cervix and vagina.  Colposcopy. A lighted, magnifying instrument is used to check the vagina for abnormal areas.  Biopsy. Cells or  tissues are removed from the vagina and areas around it, such as the lymph nodes, to check for signs of cancer. These cells are checked in a lab. Other tests may be done to find out if your cancer has spread (metastasized) to other parts of your body. These tests may include:  Imaging tests, such as an ultrasound, chest X-ray, CT scan, MRI, or PET scan.  Proctosigmoidoscopy. A thin, lighted tube is inserted into the rectum to look for cancer.  Cystoscopy. A thin tube with a lens and a light is inserted into the bladder through the urethra to look for cancer. If something suspicious is found, a biopsy may be done. How is this treated? Treatment for this condition may include:  Laser therapy to remove surface cells. This is often used when precancerous cells are found.  Surgery to help remove the cancer from your body.  Radiation therapy. This treatment uses X-rays to kill cancer cells.  Brachytherapy, which involves placing small radioactive capsules inside the body where the cancer was removed.  Medicines to kill cancer cells or to slow their growth (chemotherapy). A combination of treatments may be used to treat vaginal cancer. Vaginal cancer can often be treated successfully when it is found early. Follow these instructions at home: Eating and drinking   Try to eat regular, healthy meals. Some of your treatments might affect your appetite. If you are having problems eating or with your appetite, ask to meet with a food and nutrition specialist (dietitian). A healthy diet includes: ?  Plenty of fruits and vegetables. ? Low-fat dairy products. ? Lean meats. ? High-fiber foods, such as whole-grain breads and cereals.  Drink enough fluid to keep your urine pale yellow. Activity  Get regular exercise. Aim for 30 minutes of moderate-intensity activity 5 times a week. Examples of moderate-intensity activity include walking and yoga. Be sure to talk with your health care provider before  starting any exercise routine.  Return to your normal activities as told by your health care provider. Ask your health care provider what activities are safe for you.  Do not have sex until your health care provider says that it is okay.  Do not lift anything that is heavier than 10 lb (4.5 kg), or the limit that you are told, until your health care provider says that it is safe. Medicines  Take over-the-counter and prescription medicines only as told by your health care provider.  Ask your health care provider if the medicine prescribed to you requires you to avoid driving or using heavy machinery. General instructions  Do not use any products that contain nicotine or tobacco, such as cigarettes, e-cigarettes, and chewing tobacco. If you need help quitting, ask your health care provider.  You may need to take these actions to prevent or treat constipation: ? Take over-the-counter or prescription medicines. ? Eat foods that are high in fiber, such as beans, whole grains, and fresh fruits and vegetables. ? Limit foods that are high in fat and processed sugars, such as fried or sweet foods.  Follow your care plan as directed by your health care provider.  Do not place anything in your vagina, such as a douche, tampon, or diaphragm until your health care provider tells you it is safe to do so.  Keep all follow-up visits as told by your health care provider. This is important. Where to find more information  American Cancer Society: www.cancer.org  [employer] (NCI): www.cancer.gov  Society of Gynecolgic Oncology (SGO): https://www.sgo.org  Foundation for [employer] Cancer: https://www.foundationforwomenscancer.org  Centers for Disease Control and Prevention (CDC): TVDivision.uy Contact a health care provider if you:  Have new or unusual symptoms.  Have a fever.  Have bleeding or bruises that you cannot explain.  Feel depressed or sad.  Have vaginal  discharge that has a bad-smelling odor. Get help right away if you:  Have unusual pain, including strong headaches.  Have shortness of breath or trouble breathing.  Feel dizzy or light-headed.  Have bleeding that does not stop. Summary  Vaginal cancer is a type of cancer that develops in the vagina, or birth canal.  Treatment may include laser therapy, surgery, radiation therapy, brachytherapy, chemotherapy, or a combination of treatments.  Follow your care plan as directed by your health care provider.  Take good care of your overall health, including eating a healthy diet, exercising regularly, and avoiding nicotine or tobacco products. This information is not intended to replace advice given to you by your health care provider. Make sure you discuss any questions you have with your health care provider. Document Revised: 01/09/2019 Document Reviewed: 01/09/2019 Elsevier Patient Education  2020 [employer].

## 2020-03-24 ENCOUNTER — Encounter: Payer: Self-pay | Admitting: Nurse Practitioner

## 2020-03-24 ENCOUNTER — Telehealth (INDEPENDENT_AMBULATORY_CARE_PROVIDER_SITE_OTHER): Payer: PRIVATE HEALTH INSURANCE | Admitting: Nurse Practitioner

## 2020-03-24 VITALS — Wt 197.0 lb

## 2020-03-24 DIAGNOSIS — J069 Acute upper respiratory infection, unspecified: Secondary | ICD-10-CM | POA: Insufficient documentation

## 2020-03-24 DIAGNOSIS — R0981 Nasal congestion: Secondary | ICD-10-CM

## 2020-03-24 MED ORDER — AFRIN NASAL SPRAY 0.05 % NA SOLN: 1.0000 | Freq: Two times a day (BID) | NASAL | 0 refills | Status: AC | PRN

## 2020-03-24 NOTE — Patient Instructions (Signed)
Viral Illness, Adult Viruses are tiny germs that can get into a person's body and cause illness. There are many different types of viruses, and they cause many types of illness. Viral illnesses can range from mild to severe. They can affect various parts of the body. Common illnesses that are caused by a virus include colds and the flu. Viral illnesses also include serious conditions such as HIV/AIDS (human immunodeficiency virus/acquired immunodeficiency syndrome). A few viruses have been linked to certain cancers. What are the causes? Many types of viruses can cause illness. Viruses invade cells in your body, multiply, and cause the infected cells to malfunction or die. When the cell dies, it releases more of the virus. When this happens, you develop symptoms of the illness, and the virus continues to spread to other cells. If the virus takes over the function of the cell, it can cause the cell to divide and grow out of control, as is the case when a virus causes cancer. Different viruses get into the body in different ways. You can get a virus by:  Swallowing food or water that is contaminated with the virus.  Breathing in droplets that have been coughed or sneezed into the air by an infected person.  Touching a surface that has been contaminated with the virus and then touching your eyes, nose, or mouth.  Being bitten by an insect or animal that carries the virus.  Having sexual contact with a person who is infected with the virus.  Being exposed to blood or fluids that contain the virus, either through an open cut or during a transfusion. If a virus enters your body, your body's defense system (immune system) will try to fight the virus. You may be at higher risk for a viral illness if your immune system is weak. What are the signs or symptoms? Symptoms vary depending on the type of virus and the location of the cells that it invades. Common symptoms of the main types of viral illnesses  include: Cold and flu viruses  Fever.  Headache.  Sore throat.  Muscle aches.  Nasal congestion.  Cough. Digestive system (gastrointestinal) viruses  Fever.  Abdominal pain.  Nausea.  Diarrhea. Liver viruses (hepatitis)  Loss of appetite.  Tiredness.  Yellowing of the skin (jaundice). Brain and spinal cord viruses  Fever.  Headache.  Stiff neck.  Nausea and vomiting.  Confusion or sleepiness. Skin viruses  Warts.  Itching.  Rash. Sexually transmitted viruses  Discharge.  Swelling.  Redness.  Rash. How is this treated? Viruses can be difficult to treat because they live within cells. Antibiotic medicines do not treat viruses because these drugs do not get inside cells. Treatment for a viral illness may include:  Resting and drinking plenty of fluids.  Medicines to relieve symptoms. These can include over-the-counter medicine for pain and fever, medicines for cough or congestion, and medicines to relieve diarrhea.  Antiviral medicines. These drugs are available only for certain types of viruses. They may help reduce flu symptoms if taken early. There are also many antiviral medicines for hepatitis and HIV/AIDS. Some viral illnesses can be prevented with vaccinations. A common example is the flu shot. Follow these instructions at home: Medicines   Take over-the-counter and prescription medicines only as told by your health care provider.  If you were prescribed an antiviral medicine, take it as told by your health care provider. Do not stop taking the medicine even if you start to feel better.  Be aware of when   antibiotics are needed and when they are not needed. Antibiotics do not treat viruses. If your health care provider thinks that you may have a bacterial infection as well as a viral infection, you may get an antibiotic. ? Do not ask for an antibiotic prescription if you have been diagnosed with a viral illness. That will not make your  illness go away faster. ? Frequently taking antibiotics when they are not needed can lead to antibiotic resistance. When this develops, the medicine no longer works against the bacteria that it normally fights. General instructions  Drink enough fluids to keep your urine clear or pale yellow.  Rest as much as possible.  Return to your normal activities as told by your health care provider. Ask your health care provider what activities are safe for you.  Keep all follow-up visits as told by your health care provider. This is important. How is this prevented? Take these actions to reduce your risk of viral infection:  Eat a healthy diet and get enough rest.  Wash your hands often with soap and water. This is especially important when you are in public places. If soap and water are not available, use hand sanitizer.  Avoid close contact with friends and family who have a viral illness.  If you travel to areas where viral gastrointestinal infection is common, avoid drinking water or eating raw food.  Keep your immunizations up to date. Get a flu shot every year as told by your health care provider.  Do not share toothbrushes, nail clippers, razors, or needles with other people.  Always practice safe sex.  Contact a health care provider if:  You have symptoms of a viral illness that do not go away.  Your symptoms come back after going away.  Your symptoms get worse. Get help right away if:  You have trouble breathing.  You have a severe headache or a stiff neck.  You have severe vomiting or abdominal pain. This information is not intended to replace advice given to you by your health care provider. Make sure you discuss any questions you have with your health care provider. Document Revised: 07/22/2017 Document Reviewed: 12/19/2015 Elsevier Patient Education  2020 [employer] Inc[employer]  

## 2020-03-24 NOTE — Progress Notes (Signed)
Wt 197 lb (89.4 kg)   BMI 30.85 kg/m    Subjective:    Patient ID: Renee Parsons, female    DOB: 1971/08/06, 49 y.o.   MRN: [id number]  HPI: Renee Parsons is a 49 y.o. female presenting with upper respiratory symptoms.  Chief Complaint  Patient presents with  . URI    pt states she has been having sinus pressure, congestion, headache, and cough since Thursday   UPPER RESPIRATORY TRACT INFECTION Onset: ~ 5 days ago Worst symptom: nasal congestion Fever: no Cough: yes; started yesterday; dry Shortness of breath: no Wheezing: no Chest pain: no Chest tightness: no Chest congestion: no Nasal congestion: yes Runny nose: yes Post nasal drip: yes  Sneezing: yes Sore throat: yes Swollen glands: yes; left side Sinus pressure: yes; left side Headache: yes Face pain: no Toothache: no Ear pain: no  Ear pressure: no  Eyes red/itching:no Eye drainage/crusting: no  Nausea:Vomiting: no  Appetite: unchanged Rash: no Fatigue: no Sick contacts: no Strep contacts: no  Context: stable Recurrent sinusitis: no Relief with OTC cold/cough medications: not tried anything Treatments attempted: hot steamy bath   No Known Allergies  Outpatient Encounter Medications as of 03/24/2020  Medication Sig  . Cholecalciferol (VITAMIN D3 PO) Take by mouth daily.  Marland Kitchen ELDERBERRY PO Take 50 mg by mouth daily.   Marland Kitchen loratadine (CLARITIN) 10 MG tablet Take 1 tablet (10 mg total) by mouth daily.  . montelukast (SINGULAIR) 10 MG tablet Take 1 tablet (10 mg total) by mouth at bedtime.  . Naproxen Sodium (ALEVE) 220 MG CAPS Take 220 mg by mouth daily.  Marland Kitchen tiZANidine (ZANAFLEX) 4 MG tablet Take 1 tablet (4 mg total) by mouth every 6 (six) hours as needed for muscle spasms.  Marland Kitchen oxymetazoline (AFRIN NASAL SPRAY) 0.05 % nasal spray Place 1 spray into both nostrils 2 (two) times daily as needed for up to 3 days for congestion.   No facility-administered encounter medications on file as of 03/24/2020.    Patient Active Problem List   Diagnosis Date Noted  . Upper respiratory tract infection 03/24/2020  . Ingrown nail 11/20/2019  . Weight loss counseling, encounter for 11/20/2019  . Allergic rhinitis 10/15/2018  . Tobacco abuse 01/24/2018  . 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 Chronic back pain    Relevant past medical, surgical, family and social history reviewed and updated as indicated. Interim medical history since our last visit reviewed.  Review of Systems  Constitutional: Negative.  Negative for activity change, appetite change and fever.  HENT: Positive for congestion (nasal), postnasal drip, rhinorrhea, sinus pain, sneezing and sore throat. Negative for ear discharge, ear pain and sinus pressure.   Eyes: Negative.  Negative for pain, discharge, redness and itching.  Respiratory: Positive for cough (dry). Negative for chest tightness, shortness of breath and wheezing.   Cardiovascular: Negative for chest pain.  Gastrointestinal: Negative.  Negative for constipation, diarrhea, nausea and vomiting.  Skin: Negative.  Negative for pallor and rash.  Neurological: Positive for headaches. Negative for dizziness and weakness.  Hematological: Positive for adenopathy.  Psychiatric/Behavioral: Negative.  Negative for confusion, decreased concentration and sleep disturbance.   Per HPI unless specifically indicated above     Objective:    Wt 197 lb (89.4 kg)   BMI 30.85 kg/m   Wt Readings from Last 3 Encounters:  03/24/20 197 lb (89.4 kg)  03/19/20 197 lb 9.6 oz (89.6 kg)  12/11/19  195 lb (88.5 kg)    Physical Exam Vitals and nursing note reviewed.  Constitutional:      General: She is not in acute distress.    Appearance: Normal appearance. She is not ill-appearing, toxic-appearing or diaphoretic.  HENT:     Head: Normocephalic and atraumatic.     Nose: Nose normal. No congestion or rhinorrhea.      Mouth/Throat:     Mouth: Mucous membranes are moist.     Pharynx: Oropharynx is clear.  Eyes:     General: No scleral icterus.    Extraocular Movements: Extraocular movements intact.  Cardiovascular:     Comments: Unable to assess heart sounds via virtual visit Pulmonary:     Effort: Pulmonary effort is normal. No respiratory distress.     Comments: Unable to assess lung sounds via virtual visit Skin:    Coloration: Skin is not jaundiced or pale.     Findings: No erythema.  Neurological:     General: No focal deficit present.     Mental Status: She is alert and oriented to person, place, and time.     Motor: No weakness.  Psychiatric:        Mood and Affect: Mood normal.        Behavior: Behavior normal.        Thought Content: Thought content normal.        Judgment: Judgment normal.        Assessment & Plan:   Problem List Items Addressed This Visit      Respiratory   Upper respiratory tract infection - Primary    Acute, ongoing.  Given length of symptoms, cause is likely viral at this point.  Encouraged covid testing today to rule out.  Will start Afrin to use as decongestant for 3 days max, then stop to prevent rebound nasal congestion.  If not better early next week, may consider oral prednisone and/or antibiotics if symptoms continue.  With any sudden onset of chest pain or shortness of breath, go to ED.       Other Visit Diagnoses    Nasal congestion           Follow up plan: No follow-ups on file.  Due to the catastrophic nature of the COVID-19 pandemic, this visit was completed via audio and visual contact via Mychart due to the restrictions of the COVID-19 pandemic. All issues as above were discussed and addressed. Physical exam was done as above through visual confirmation on Mychart. If it was felt that the patient should be evaluated in the office, they were directed there. The patient verbally consented to this visit."} . Location of the patient:  home . Location of the provider: home . Those involved with this call:  . Provider: Mardene Celeste, DNP . CMA: Wilhemena Durie, CMA . Front Desk/Registration: [facility]  . Time spent on call: 16 minutes on the phone discussing health concerns. 20 minutes total spent in review of patient's record and preparation of their chart.   I verified patient identity using two factors (patient name and date of birth). Patient consents verbally to being seen via telemedicine visit today.

## 2020-03-24 NOTE — Assessment & Plan Note (Signed)
Acute, ongoing.  Given length of symptoms, cause is likely viral at this point.  Encouraged covid testing today to rule out.  Will start Afrin to use as decongestant for 3 days max, then stop to prevent rebound nasal congestion.  If not better early next week, may consider oral prednisone and/or antibiotics if symptoms continue.  With any sudden onset of chest pain or shortness of breath, go to ED.

## 2020-07-09 ENCOUNTER — Telehealth: Payer: Self-pay

## 2020-07-09 NOTE — Telephone Encounter (Signed)
Pt stated she needs form for school singed, Pt last physical on 11/20/2019.left form in bin to be reviewed.

## 2020-07-14 NOTE — Telephone Encounter (Signed)
Form in providers folder to be reviewed.

## 2020-07-22 NOTE — Telephone Encounter (Signed)
Form completed and signed by Shanda Bumps. Tried calling patient to let her know that they were ready for pick up. No answer and no VM, will try to call again later.  Copy placed in scan bin and original in the bin for patient to pick up.

## 2020-07-23 NOTE — Telephone Encounter (Signed)
Called and left a detailed message for patient letting her know that the paperwork is ready for pick up.

## 2020-07-24 ENCOUNTER — Telehealth: Payer: Self-pay

## 2020-07-24 NOTE — Telephone Encounter (Signed)
Have documents on my desk, will fill in vaccines from [facility]

## 2020-07-24 NOTE — Telephone Encounter (Signed)
Pt brought in forms she needs filled out for school, forms left in bin to be reviewed.

## 2020-07-25 NOTE — Telephone Encounter (Signed)
Not due for physcial unitl March. OK to come in for Tdap. If needs physical form, will need to be seen.

## 2020-07-25 NOTE — Telephone Encounter (Signed)
Nurse visit scheduled.

## 2020-07-25 NOTE — Telephone Encounter (Signed)
Patient is due for a t-dap, can she just come heave this done or does she need an appt

## 2020-07-28 ENCOUNTER — Other Ambulatory Visit: Payer: Self-pay

## 2020-07-28 ENCOUNTER — Ambulatory Visit (INDEPENDENT_AMBULATORY_CARE_PROVIDER_SITE_OTHER): Payer: PRIVATE HEALTH INSURANCE

## 2020-07-28 DIAGNOSIS — Z23 Encounter for immunization: Secondary | ICD-10-CM | POA: Diagnosis not present

## 2020-12-02 ENCOUNTER — Encounter: Payer: Self-pay | Admitting: Family Medicine

## 2020-12-02 ENCOUNTER — Other Ambulatory Visit: Payer: Self-pay

## 2020-12-02 ENCOUNTER — Ambulatory Visit (INDEPENDENT_AMBULATORY_CARE_PROVIDER_SITE_OTHER): Payer: PRIVATE HEALTH INSURANCE | Admitting: Family Medicine

## 2020-12-02 VITALS — BP 124/85 | HR 77 | Temp 98.7°F | Wt 209.2 lb

## 2020-12-02 DIAGNOSIS — Z13220 Encounter for screening for lipoid disorders: Secondary | ICD-10-CM

## 2020-12-02 DIAGNOSIS — E66812 Obesity, class 2: Secondary | ICD-10-CM | POA: Insufficient documentation

## 2020-12-02 DIAGNOSIS — E6609 Other obesity due to excess calories: Secondary | ICD-10-CM | POA: Diagnosis not present

## 2020-12-02 DIAGNOSIS — R232 Flushing: Secondary | ICD-10-CM | POA: Diagnosis not present

## 2020-12-02 DIAGNOSIS — B372 Candidiasis of skin and nail: Secondary | ICD-10-CM

## 2020-12-02 DIAGNOSIS — Z6832 Body mass index (BMI) 32.0-32.9, adult: Secondary | ICD-10-CM

## 2020-12-02 LAB — URINALYSIS, ROUTINE W REFLEX MICROSCOPIC
Bilirubin, UA: NEGATIVE
Glucose, UA: NEGATIVE
Ketones, UA: NEGATIVE
Leukocytes,UA: NEGATIVE
Nitrite, UA: NEGATIVE
Protein,UA: NEGATIVE
RBC, UA: NEGATIVE
Specific Gravity, UA: 1.01 (ref 1.005–1.030)
Urobilinogen, Ur: 0.2 mg/dL (ref 0.2–1.0)
pH, UA: 6 (ref 5.0–7.5)

## 2020-12-02 MED ORDER — NYSTATIN 100000 UNIT/GM EX POWD: 1.0000 "application " | Freq: Three times a day (TID) | CUTANEOUS | 1 refills | Status: DC

## 2020-12-02 NOTE — Assessment & Plan Note (Signed)
Will check on labs to look for other causes. Will check with her insurance to see what is covered. Will treat as able. Recheck 4-6 weeks. Call with any concerns.

## 2020-12-02 NOTE — Progress Notes (Signed)
BP 124/85   Pulse 77   Temp 98.7 F (37.1 C)   Wt 209 lb 3.2 oz (94.9 kg)   SpO2 100%   BMI 32.77 kg/m    Subjective:    Patient ID: Renee Parsons, female    DOB: [birth year], 50 y.o.   MRN: [id number]  HPI: Renee Parsons is a 50 y.o. female  Chief Complaint  Patient presents with  . Obesity    Patient states she is interested in weight loss medication    OBESITY Duration: chronic Previous attempts at weight loss: yes - dieting, low carb, high protein Complications of obesity: Candida interigo [place] weight: 209 (current) Weight loss goal: to be healthy Weight loss to date: none Requesting obesity pharmacotherapy: yes Current weight loss supplements/medications: no Previous weight loss supplements/meds: no  SKIN INFECTION Duration: weeks Location: under skin folds History of trauma in area: no Pain: no Quality: itchy and burning Severity: moderateH3 Redness: yes Swelling: no Oozing: no Pus: no Fevers: no Nausea/vomiting: no Status: fluctuating Treatments attempted:warm compresses  Tetanus: UTD   Relevant past medical, surgical, family and social history reviewed and updated as indicated. Interim medical history since our last visit reviewed. Allergies and medications reviewed and updated.  Review of Systems  Constitutional: Negative.   Respiratory: Negative.   Cardiovascular: Negative.   Gastrointestinal: Negative.   Musculoskeletal: Negative.   Skin: Positive for rash. Negative for color change, pallor and wound.  Neurological: Negative.   Psychiatric/Behavioral: Negative.     Per HPI unless specifically indicated above     Objective:    BP 124/85   Pulse 77   Temp 98.7 F (37.1 C)   Wt 209 lb 3.2 oz (94.9 kg)   SpO2 100%   BMI 32.77 kg/m   Wt Readings from Last 3 Encounters:  12/02/20 209 lb 3.2 oz (94.9 kg)  03/24/20 197 lb (89.4 kg)  03/19/20 197 lb 9.6 oz (89.6 kg)    Physical Exam Vitals and nursing note reviewed.   Constitutional:      General: She is not in acute distress.    Appearance: Normal appearance. She is not ill-appearing, toxic-appearing or diaphoretic.  HENT:     Head: Normocephalic and atraumatic.     Right Ear: External ear normal.     Left Ear: External ear normal.     Nose: Nose normal.     Mouth/Throat:     Mouth: Mucous membranes are moist.     Pharynx: Oropharynx is clear.  Eyes:     General: No scleral icterus.       Right eye: No discharge.        Left eye: No discharge.     Extraocular Movements: Extraocular movements intact.     Conjunctiva/sclera: Conjunctivae normal.     Pupils: Pupils are equal, round, and reactive to light.  Cardiovascular:     Rate and Rhythm: Normal rate and regular rhythm.     Pulses: Normal pulses.     Heart sounds: Normal heart sounds. No murmur heard. No friction rub. No gallop.   Pulmonary:     Effort: Pulmonary effort is normal. No respiratory distress.     Breath sounds: Normal breath sounds. No stridor. No wheezing, rhonchi or rales.  Chest:     Chest wall: No tenderness.  Musculoskeletal:        General: Normal range of motion.     Cervical back: Normal range of motion and neck supple.  Skin:  General: Skin is warm and dry.     Capillary Refill: Capillary refill takes less than 2 seconds.     Coloration: Skin is not jaundiced or pale.     Findings: Rash (erythematous shiny skin in belly folds) present. No bruising, erythema or lesion.  Neurological:     General: No focal deficit present.     Mental Status: She is alert and oriented to person, place, and time. Mental status is at baseline.  Psychiatric:        Mood and Affect: Mood normal.        Behavior: Behavior normal.        Thought Content: Thought content normal.        Judgment: Judgment normal.     Results for orders placed or performed during the hospital encounter of 12/06/19  Surgical pathology  Result Value Ref Range   SURGICAL PATHOLOGY      SURGICAL  PATHOLOGY CASE: [id number] PATIENT: Renee Parsons Surgical Pathology Report     Specimen Submitted: A. Colon polyp, descending; cold snare  Clinical History: Screening colonoscopy.  Colon polyp; sigmoid diverticulosis    DIAGNOSIS: A. COLON POLYP, DESCENDING; COLD SNARE: - BENIGN COLONIC MUCOSA WITH PROMINENT LYMPHOID AGGREGATE AND SUPERFICIAL REACTIVE CHANGES. - NEGATIVE FOR DYSPLASIA AND MALIGNANCY.  GROSS DESCRIPTION: A. Labeled: Descending colon polyp x1 cold snare Received: Formalin Tissue fragment(s): 2 Size: 0.3-1.1 cm Description: Tan soft tissue fragments Entirely submitted in 1 cassette.     Final Diagnosis performed by Katherine Mantle, MD.   Electronically signed 12/10/2019 8:49:22AM The electronic signature indicates that the named Attending Pathologist has evaluated the specimen Technical component performed at [facility], [address], [place], [place] [address] Lab: [phone] Dir: Jolene Schimke, MD, MMM  Professional componen t performed at [facility], [address] [place], [place], [place] [address] Lab: [phone] Dir: Georgiann Cocker. Rubinas, MD       Assessment & Plan:   Problem List Items Addressed This Visit      Other   Class 1 obesity due to excess calories without serious comorbidity with body mass index (BMI) of 32.0 to 32.9 in adult - Primary    Will check on labs to look for other causes. Will check with her insurance to see what is covered. Will treat as able. Recheck 4-6 weeks. Call with any concerns.       Relevant Orders   CBC with Differential/Platelet   Comprehensive metabolic panel   Urinalysis, Routine w reflex microscopic   TSH    Other Visit Diagnoses    Candidal intertrigo       Will treat with nystatin. Call if not getting better or getting worse.    Relevant Medications   nystatin (MYCOSTATIN/NYSTOP) powder   Hot flashes       Checking labs. Await results. Treat as needed.    Relevant Orders    TSH   LH   FSH   Estradiol   Screening for cholesterol level       Labs drawn today. Await results.    Relevant Orders   Lipid Panel w/o Chol/HDL Ratio       Follow up plan: Return 4-6 weeks, for physical.

## 2020-12-02 NOTE — Patient Instructions (Addendum)
Renee Parsons (1x a week shot) Saxenda (1x a day shot) Contrave (pill)  Liraglutide injection (Weight Management) What is this medicine? LIRAGLUTIDE (LIR a GLOO tide) is used to help people lose weight and maintain weight loss. It is used with a reduced-calorie diet and exercise. This medicine may be used for other purposes; ask your health care provider or pharmacist if you have questions. COMMON BRAND NAME(S): Saxenda What should I tell my health care provider before I take this medicine? They need to know if you have any of these conditions:  endocrine tumors (MEN 2) or if someone in your family had these tumors  gallbladder disease  high cholesterol  history of alcohol abuse problem  history of pancreatitis  kidney disease or if you are on dialysis  liver disease  previous swelling of the tongue, face, or lips with difficulty breathing, difficulty swallowing, hoarseness, or tightening of the throat  stomach problems  suicidal thoughts, plans, or attempt; a previous suicide attempt by you or a family member  thyroid cancer or if someone in your family had thyroid cancer  an unusual or allergic reaction to liraglutide, other medicines, foods, dyes, or preservatives  pregnant or trying to get pregnant  breast-feeding How should I use this medicine? This medicine is for injection under the skin of your upper leg, stomach area, or upper arm. You will be taught how to prepare and give this medicine. Use exactly as directed. Take your medicine at regular intervals. Do not take it more often than directed. This medicine comes with INSTRUCTIONS FOR USE. Ask your pharmacist for directions on how to use this medicine. Read the information carefully. Talk to your pharmacist or health care provider if you have questions. It is important that you put your used needles and syringes in a special sharps container. Do not put them in a trash can. If you do not have a sharps container, call your  pharmacist or healthcare provider to get one. A special MedGuide will be given to you by the pharmacist with each prescription and refill. Be sure to read this information carefully each time. Talk to your health care provider about the use of this medicine in children. While it may be prescribed for children as young as 84 years of age for selected conditions, precautions do apply. Overdosage: If you think you have taken too much of this medicine contact a poison control center or emergency room at once. NOTE: This medicine is only for you. Do not share this medicine with others. What if I miss a dose? If you miss a dose, take it as soon as you can. If it is almost time for your next dose, take only that dose. Do not take double or extra doses. If you miss your dose for 3 days or more, call your doctor or health care professional to talk about how to restart this medicine. What may interact with this medicine?  insulin and other medicines for diabetes This list may not describe all possible interactions. Give your health care provider a list of all the medicines, herbs, non-prescription drugs, or dietary supplements you use. Also tell them if you smoke, drink alcohol, or use illegal drugs. Some items may interact with your medicine. What should I watch for while using this medicine? Visit your doctor or health care professional for regular checks on your progress. Drink plenty of fluids while taking this medicine. Check with your doctor or health care professional if you get an attack of severe diarrhea,  nausea, and vomiting. The loss of too much body fluid can make it dangerous for you to take this medicine. This medicine may affect blood sugar levels. Ask your healthcare provider if changes in diet or medicines are needed if you have diabetes. Patients and their families should watch out for worsening depression or thoughts of suicide. Also watch out for sudden changes in feelings such as feeling  anxious, agitated, panicky, irritable, hostile, aggressive, impulsive, severely restless, overly excited and hyperactive, or not being able to sleep. If this happens, especially at the beginning of treatment or after a change in dose, call your health care professional. Women should inform their health care provider if they wish to become pregnant or think they might be pregnant. Losing weight while pregnant is not advised and may cause harm to the unborn child. Talk to your health care provider for more information. What side effects may I notice from receiving this medicine? Side effects that you should report to your doctor or health care professional as soon as possible:  allergic reactions like skin rash, itching or hives, swelling of the face, lips, or tongue  breathing problems  diarrhea that continues or is severe  lump or swelling on the neck  severe nausea  signs and symptoms of infection like fever or chills; cough; sore throat; pain or trouble passing urine  signs and symptoms of low blood sugar such as feeling anxious; confusion; dizziness; increased hunger; unusually weak or tired; increased sweating; shakiness; cold, clammy skin; irritable; headache; blurred vision; fast heartbeat; loss of consciousness  signs and symptoms of kidney injury like trouble passing urine or change in the amount of urine  trouble swallowing  unusual stomach upset or pain  vomiting Side effects that usually do not require medical attention (report to your doctor or health care professional if they continue or are bothersome):  constipation  decreased appetite  diarrhea  fatigue  headache  nausea  pain, redness, or irritation at site where injected  stomach upset  stuffy or runny nose This list may not describe all possible side effects. Call your doctor for medical advice about side effects. You may report side effects to FDA at 1-800-FDA-1088. Where should I keep my  medicine? Keep out of the reach of children. Store unopened pen in a refrigerator between 2 and 8 degrees C (36 and 46 degrees F). Do not freeze or use if the medicine has been frozen. Protect from light and excessive heat. After you first use the pen, it can be stored at room temperature between 15 and 30 degrees C (59 and 86 degrees F) or in a refrigerator. Throw away your used pen after 30 days or after the expiration date, whichever comes first. Do not store your pen with the needle attached. If the needle is left on, medicine may leak from the pen. NOTE: This sheet is a summary. It may not cover all possible information. If you have questions about this medicine, talk to your doctor, pharmacist, or health care provider.  2021 [employer]/Gold Standard (2019-08-02 13:56:25) Bupropion; Naltrexone extended-release tablets What is this medicine? BUPROPION; NALTREXONE (byoo PROE pee on; nal TREX one) is a combination of two drugs that help you lose weight. This product is used with a reduced calorie diet and exercise. This product can also help you maintain weight loss. This medicine may be used for other purposes; ask your health care provider or pharmacist if you have questions. COMMON BRAND NAME(S): Contrave What should I tell my  health care provider before I take this medicine? They need to know if you have any of these conditions:  an eating disorder, such as anorexia or bulimia  diabetes  depression  glaucoma  head injury  heart disease  high blood pressure  history of drug abuse or alcohol abuse problem  history of a tumor or infection of your brain or spine  history of heart attack or stroke  history of irregular heartbeat  if you often drink alcohol  kidney disease  liver disease  low levels of sodium in the blood  mental illness  seizures  suicidal thoughts, plans, or attempt; a previous suicide attempt by you or a family member  taken an MAOI like Carbex,  Eldepryl, Marplan, Nardil, or Parnate in last 14 days  an unusual or allergic reaction to bupropion, naltrexone, other medicines, foods, dyes, or preservatives  breast-feeding  pregnant or trying to become pregnant How should I use this medicine? Take this medicine by mouth with a glass of water. Follow the directions on the prescription label. Do not cut, crush or chew this medicine. Swallow the tablets whole. You can take it with or without food. Do not take with high-fat meals as this may increase your risk of seizures. Take your medicine at regular intervals. Do not take it more often than directed. Do not stop taking except on your doctor's advice. A special MedGuide will be given to you by the pharmacist with each prescription and refill. Be sure to read this information carefully each time. Talk to your pediatrician regarding the use of this medicine in children. Special care may be needed. Overdosage: If you think you have taken too much of this medicine contact a poison control center or emergency room at once. NOTE: This medicine is only for you. Do not share this medicine with others. What if I miss a dose? If you miss a dose, skip it. Take your next dose at the normal time. Do not take extra or 2 doses at the same time to make up for the missed dose. What may interact with this medicine? Do not take this medicine with any of the following medications:  any medicines used to stop taking opioids such as methadone or buprenorphine  linezolid  MAOIs like Carbex, Eldepryl, Marplan, Nardil, and Parnate  methylene blue (injected into a vein)  often take narcotic medicines for pain or cough  other medicines that contain bupropion like Zyban or Wellbutrin This medicine may also interact with the following medications:  alcohol  certain medicines for blood pressure like metoprolol, propranolol  certain medicines for depression, anxiety, or psychotic disturbances  certain  medicines for HIV or hepatitis  certain medicines for irregular heart beat like propafenone, flecainide  certain medicines for Parkinson's disease like amantadine, levodopa  certain medicines for seizures like carbamazepine, phenytoin, phenobarbital  certain medicines for sleep  cimetidine  clopidogrel  cyclophosphamide  digoxin  disulfiram  furazolidone  isoniazid  nicotine  orphenadrine  procarbazine  steroid medicines like prednisone or cortisone  stimulant medicines for attention disorders, weight loss, or to stay awake  tamoxifen  theophylline  thiotepa  ticlopidine  tramadol  warfarin This list may not describe all possible interactions. Give your health care provider a list of all the medicines, herbs, non-prescription drugs, or dietary supplements you use. Also tell them if you smoke, drink alcohol, or use illegal drugs. Some items may interact with your medicine. What should I watch for while using this medicine? Visit your  doctor or healthcare provider for regular checks on your progress. This medicine may cause serious skin reactions. They can happen weeks to months after starting the medicine. Contact your healthcare provider right away if you notice fevers or flu-like symptoms with a rash. The rash may be red or purple and then turn into blisters or peeling of the skin. Or, you might notice a red rash with swelling of the face, lips or lymph nodes in your neck or under your arms. This medicine may affect blood sugar. Ask your healthcare provider if changes in diet or medicines are needed if you have diabetes. Patients and their families should watch out for new or worsening depression or thoughts of suicide. Also watch out for sudden changes in feelings such as feeling anxious, agitated, panicky, irritable, hostile, aggressive, impulsive, severely restless, overly excited and hyperactive, or not being able to sleep. If this happens, especially at the  beginning of treatment or after a change in dose, call your healthcare provider. Avoid alcoholic drinks while taking this medicine. Drinking large amounts of alcoholic beverages, using sleeping or anxiety medicines, or quickly stopping the use of these agents while taking this medicine may increase your risk for a seizure. Do not drive or use heavy machinery until you know how this medicine affects you. This medicine can impair your ability to perform these tasks. Women should inform their health care provider if they wish to become pregnant or think they might be pregnant. Losing weight while pregnant is not advised and may cause harm to the unborn child. Talk to your health care provider for more information. What side effects may I notice from receiving this medicine? Side effects that you should report to your doctor or health care professional as soon as possible:  allergic reactions like skin rash, itching or hives, swelling of the face, lips, or tongue  breathing problems  changes in vision  confusion  elevated mood, decreased need for sleep, racing thoughts, impulsive behavior  fast or irregular heartbeat  hallucinations, loss of contact with reality  increased blood pressure  rash, fever, and swollen lymph nodes  redness, blistering, peeling, or loosening of the skin, including inside the mouth  seizures  signs and symptoms of liver injury like dark yellow or brown urine; general ill feeling or flu-like symptoms; light-colored stools; loss of appetite; nausea; right upper belly pain; unusually weak or tired; yellowing of the eyes or skin  suicidal thoughts or other mood changes  vomiting Side effects that usually do not require medical attention (report to your doctor or health care professional if they continue or are bothersome):  constipation  headache  loss of appetite  indigestion, stomach upset  tremors This list may not describe all possible side effects.  Call your doctor for medical advice about side effects. You may report side effects to FDA at 1-800-FDA-1088. Where should I keep my medicine? Keep out of the reach of children. Store at room temperature between 15 and 30 degrees C (59 and 86 degrees F). Throw away any unused medicine after the expiration date. NOTE: This sheet is a summary. It may not cover all possible information. If you have questions about this medicine, talk to your doctor, pharmacist, or health care provider.  2021 [employer] Standard (2019-06-15 15:05:69)

## 2020-12-03 LAB — CBC WITH DIFFERENTIAL/PLATELET
Basophils Absolute: 0.1 10*3/uL (ref 0.0–0.2)
Basos: 1 %
EOS (ABSOLUTE): 0.2 10*3/uL (ref 0.0–0.4)
Eos: 3 %
Hematocrit: 42.2 % (ref 34.0–46.6)
Hemoglobin: 14 g/dL (ref 11.1–15.9)
Immature Grans (Abs): 0 10*3/uL (ref 0.0–0.1)
Immature Granulocytes: 0 %
Lymphocytes Absolute: 2.2 10*3/uL (ref 0.7–3.1)
Lymphs: 34 %
MCH: 28.4 pg (ref 26.6–33.0)
MCHC: 33.2 g/dL (ref 31.5–35.7)
MCV: 86 fL (ref 79–97)
Monocytes Absolute: 0.4 10*3/uL (ref 0.1–0.9)
Monocytes: 7 %
Neutrophils Absolute: 3.5 10*3/uL (ref 1.4–7.0)
Neutrophils: 55 %
Platelets: 398 10*3/uL (ref 150–450)
RBC: 4.93 x10E6/uL (ref 3.77–5.28)
RDW: 12.9 % (ref 11.7–15.4)
WBC: 6.4 10*3/uL (ref 3.4–10.8)

## 2020-12-03 LAB — LIPID PANEL W/O CHOL/HDL RATIO
Cholesterol, Total: 238 mg/dL — ABNORMAL HIGH (ref 100–199)
HDL: 61 mg/dL (ref >39)
LDL Chol Calc (NIH): 156 mg/dL — ABNORMAL HIGH (ref 0–99)
Triglycerides: 118 mg/dL (ref 0–149)
VLDL Cholesterol Cal: 21 mg/dL (ref 5–40)

## 2020-12-03 LAB — COMPREHENSIVE METABOLIC PANEL
ALT: 26 IU/L (ref 0–32)
AST: 29 IU/L (ref 0–40)
Albumin/Globulin Ratio: 1.2 (ref 1.2–2.2)
Albumin: 4.6 g/dL (ref 3.8–4.9)
Alkaline Phosphatase: 151 IU/L — ABNORMAL HIGH (ref 44–121)
BUN/Creatinine Ratio: 10 (ref 9–23)
BUN: 9 mg/dL (ref 6–24)
Bilirubin Total: 0.3 mg/dL (ref 0.0–1.2)
CO2: 19 mmol/L — ABNORMAL LOW (ref 20–29)
Calcium: 9.8 mg/dL (ref 8.7–10.2)
Chloride: 103 mmol/L (ref 96–106)
Creatinine, Ser: 0.87 mg/dL (ref 0.57–1.00)
Globulin, Total: 3.7 g/dL (ref 1.5–4.5)
Glucose: 88 mg/dL (ref 65–99)
Potassium: 4.4 mmol/L (ref 3.5–5.2)
Sodium: 139 mmol/L (ref 134–144)
Total Protein: 8.3 g/dL (ref 6.0–8.5)
eGFR: 81 mL/min/{1.73_m2} (ref >59)

## 2020-12-03 LAB — FOLLICLE STIMULATING HORMONE: FSH: 64.3 m[IU]/mL

## 2020-12-03 LAB — LUTEINIZING HORMONE: LH: 35 m[IU]/mL

## 2020-12-03 LAB — TSH: TSH: 2.78 u[IU]/mL (ref 0.450–4.500)

## 2020-12-03 LAB — ESTRADIOL: Estradiol: <5 pg/mL

## 2021-01-14 ENCOUNTER — Encounter: Payer: PRIVATE HEALTH INSURANCE | Admitting: Family Medicine

## 2021-01-15 ENCOUNTER — Ambulatory Visit (INDEPENDENT_AMBULATORY_CARE_PROVIDER_SITE_OTHER): Payer: PRIVATE HEALTH INSURANCE | Admitting: Family Medicine

## 2021-01-15 ENCOUNTER — Encounter: Payer: Self-pay | Admitting: Family Medicine

## 2021-01-15 ENCOUNTER — Other Ambulatory Visit: Payer: Self-pay

## 2021-01-15 VITALS — BP 120/79 | HR 84 | Temp 98.2°F | Ht 65.5 in | Wt 208.0 lb

## 2021-01-15 DIAGNOSIS — Z6832 Body mass index (BMI) 32.0-32.9, adult: Secondary | ICD-10-CM | POA: Diagnosis not present

## 2021-01-15 DIAGNOSIS — Z0000 Encounter for general adult medical examination without abnormal findings: Secondary | ICD-10-CM

## 2021-01-15 DIAGNOSIS — J3089 Other allergic rhinitis: Secondary | ICD-10-CM

## 2021-01-15 DIAGNOSIS — S0300XD Dislocation of jaw, unspecified side, subsequent encounter: Secondary | ICD-10-CM

## 2021-01-15 DIAGNOSIS — E6609 Other obesity due to excess calories: Secondary | ICD-10-CM | POA: Diagnosis not present

## 2021-01-15 MED ORDER — TIZANIDINE HCL 4 MG PO TABS: 4.0000 mg | ORAL_TABLET | Freq: Four times a day (QID) | ORAL | 1 refills | Status: DC | PRN

## 2021-01-15 MED ORDER — MONTELUKAST SODIUM 10 MG PO TABS
10.0000 mg | ORAL_TABLET | Freq: Every day | ORAL | 3 refills | Status: DC
Start: 2021-01-15 — End: 2024-05-16

## 2021-01-15 MED ORDER — LORATADINE 10 MG PO TABS: 10.0000 mg | ORAL_TABLET | Freq: Every day | ORAL | 3 refills | Status: DC

## 2021-01-15 MED ORDER — CONTRAVE 8-90 MG PO TB12: ORAL_TABLET | ORAL | 0 refills | Status: DC

## 2021-01-15 NOTE — Progress Notes (Signed)
BP 120/79   Pulse 84   Temp 98.2 F (36.8 C)   Ht 5' 5.5" (1.664 m)   Wt 208 lb (94.3 kg)   SpO2 99%   BMI 34.09 kg/m    Subjective:    Patient ID: Renee Parsons, female    DOB: [birth year], 50 y.o.   MRN: [id number]  HPI: Renee Parsons is a 50 y.o. female presenting on 01/15/2021 for comprehensive medical examination. Current medical complaints include:  OBESITY Duration: chronic Previous attempts at weight loss: yes - dieting, low carb, high protein Complications of obesity: candida interigo  Peak weight: 209 Weight loss goal: to be healthy  Weight loss to date: 1lb Requesting obesity pharmacotherapy: yes Current weight loss supplements/medications: no Previous weight loss supplements/meds: no  Menopausal Symptoms: yes  Depression Screen done today and results listed below:  Depression screen [facility] 2/9 01/15/2021 03/19/2020 11/20/2019 10/30/2019 01/24/2018  Decreased Interest 0 0 0 0 0  Down, Depressed, Hopeless 0 0 0 0 0  PHQ - 2 Score 0 0 0 0 0  Altered sleeping - 0 0 - 3  Tired, decreased energy - 0 0 - 0  Change in appetite - 0 0 - 0  Feeling bad or failure about yourself  - 0 0 - 0  Trouble concentrating - 0 0 - 0  Moving slowly or fidgety/restless - 0 0 - 0  Suicidal thoughts - 0 0 - 0  PHQ-9 Score - 0 0 - 3  Difficult doing work/chores - - Not difficult at all - Not difficult at all    Past Medical History:  Past Medical History:  Diagnosis Date  . Chronic back pain     Surgical History:  Past Surgical History:  Procedure Laterality Date  . ABDOMINAL HYSTERECTOMY    . COLONOSCOPY WITH PROPOFOL N/A 12/06/2019   Procedure: COLONOSCOPY WITH BIOPSY;  Surgeon: Lin Landsman, MD;  Location: [place];  Service: Endoscopy;  Laterality: N/A;  priority 4  . POLYPECTOMY N/A 12/06/2019   Procedure: POLYPECTOMY;  Surgeon: Lin Landsman, MD;  Location: [place];  Service: Endoscopy;  Laterality: N/A;    Medications:  Current  Outpatient Medications on File Prior to Visit  Medication Sig  . Cholecalciferol (VITAMIN D3 PO) Take by mouth daily.  Marland Kitchen ELDERBERRY PO Take 50 mg by mouth daily.   . Naproxen Sodium 220 MG CAPS Take 220 mg by mouth daily.  Marland Kitchen nystatin (MYCOSTATIN/NYSTOP) powder Apply 1 application topically 3 (three) times daily.   No current facility-administered medications on file prior to visit.    Allergies:  No Known Allergies  Social History:  Social History   Socioeconomic History  . Marital status: Married    Spouse name: Not on file  . Number of children: Not on file  . Years of education: Not on file  . Highest education level: Not on file  Occupational History  . Not on file  Tobacco Use  . Smoking status: Former Smoker    Quit date: 08/23/2016    Years since quitting: 4.4  . Smokeless tobacco: Never Used  Vaping Use  . Vaping Use: Never used  Substance and Sexual Activity  . Alcohol use: Yes    Alcohol/week: 4.0 standard drinks    Types: 4 Glasses of wine per week  . Drug use: No  . Sexual activity: Not Currently    Birth control/protection: Surgical  Other Topics Concern  . Not on file  Social History Narrative  .  Not on file   Social Determinants of Health   Financial Resource Strain: Not on file  Food Insecurity: Not on file  Transportation Needs: Not on file  Physical Activity: Not on file  Stress: Not on file  Social Connections: Not on file  Intimate Partner Violence: Not on file   Social History   Tobacco Use  Smoking Status Former Smoker  . Quit date: 08/23/2016  . Years since quitting: 4.4  Smokeless Tobacco Never Used   Social History   Substance and Sexual Activity  Alcohol Use Yes  . Alcohol/week: 4.0 standard drinks  . Types: 4 Glasses of wine per week    Family History:  Family History  Problem Relation Age of Onset  . Migraines Mother   . Cancer Father   . Diabetes Maternal Grandmother     Past medical history, surgical history,  medications, allergies, family history and social history reviewed with patient today and changes made to appropriate areas of the chart.   Review of Systems  Constitutional: Positive for diaphoresis. Negative for chills, fever, malaise/fatigue and weight loss.  HENT: Negative.   Eyes: Negative.   Respiratory: Negative.   Cardiovascular: Negative.   Gastrointestinal: Positive for constipation. Negative for abdominal pain, blood in stool, diarrhea, heartburn, melena, nausea and vomiting.  Genitourinary: Negative.   Musculoskeletal: Positive for joint pain (shooting pain R knee). Negative for back pain, falls, myalgias and neck pain.  Skin: Negative.   Neurological: Negative.   Endo/Heme/Allergies: Positive for environmental allergies. Negative for polydipsia. Bruises/bleeds easily.  Psychiatric/Behavioral: Negative.    All other ROS negative except what is listed above and in the HPI.      Objective:    BP 120/79   Pulse 84   Temp 98.2 F (36.8 C)   Ht 5' 5.5" (1.664 m)   Wt 208 lb (94.3 kg)   SpO2 99%   BMI 34.09 kg/m   Wt Readings from Last 3 Encounters:  01/15/21 208 lb (94.3 kg)  12/02/20 209 lb 3.2 oz (94.9 kg)  03/24/20 197 lb (89.4 kg)    Physical Exam Vitals and nursing note reviewed.  Constitutional:      General: She is not in acute distress.    Appearance: Normal appearance. She is not ill-appearing, toxic-appearing or diaphoretic.  HENT:     Head: Normocephalic and atraumatic.     Right Ear: Tympanic membrane, ear canal and external ear normal. There is no impacted cerumen.     Left Ear: Tympanic membrane, ear canal and external ear normal. There is no impacted cerumen.     Nose: Nose normal. No congestion or rhinorrhea.     Mouth/Throat:     Mouth: Mucous membranes are moist.     Pharynx: Oropharynx is clear. No oropharyngeal exudate or posterior oropharyngeal erythema.  Eyes:     General: No scleral icterus.       Right eye: No discharge.        Left  eye: No discharge.     Extraocular Movements: Extraocular movements intact.     Conjunctiva/sclera: Conjunctivae normal.     Pupils: Pupils are equal, round, and reactive to light.  Neck:     Vascular: No carotid bruit.  Cardiovascular:     Rate and Rhythm: Normal rate and regular rhythm.     Pulses: Normal pulses.     Heart sounds: No murmur heard. No friction rub. No gallop.   Pulmonary:     Effort: Pulmonary effort is normal. No  respiratory distress.     Breath sounds: Normal breath sounds. No stridor. No wheezing, rhonchi or rales.  Chest:     Chest wall: No tenderness.  Abdominal:     General: Abdomen is flat. Bowel sounds are normal. There is no distension.     Palpations: Abdomen is soft. There is no mass.     Tenderness: There is no abdominal tenderness. There is no right CVA tenderness, left CVA tenderness, guarding or rebound.     Hernia: No hernia is present.  Genitourinary:    Comments: Breast and pelvic exams deferred with shared decision making Musculoskeletal:        General: No swelling, tenderness, deformity or signs of injury.     Cervical back: Normal range of motion and neck supple. No rigidity. No muscular tenderness.     Right lower leg: No edema.     Left lower leg: No edema.  Lymphadenopathy:     Cervical: No cervical adenopathy.  Skin:    General: Skin is warm and dry.     Capillary Refill: Capillary refill takes less than 2 seconds.     Coloration: Skin is not jaundiced or pale.     Findings: No bruising, erythema, lesion or rash.  Neurological:     General: No focal deficit present.     Mental Status: She is alert and oriented to person, place, and time. Mental status is at baseline.     Cranial Nerves: No cranial nerve deficit.     Sensory: No sensory deficit.     Motor: No weakness.     Coordination: Coordination normal.     Gait: Gait normal.     Deep Tendon Reflexes: Reflexes normal.  Psychiatric:        Mood and Affect: Mood normal.         Behavior: Behavior normal.        Thought Content: Thought content normal.        Judgment: Judgment normal.     Results for orders placed or performed in visit on 12/02/20  CBC with Differential/Platelet  Result Value Ref Range   WBC 6.4 3.4 - 10.8 x10E3/uL   RBC 4.93 3.77 - 5.28 x10E6/uL   Hemoglobin 14.0 11.1 - 15.9 g/dL   Hematocrit 42.2 34.0 - 46.6 %   MCV 86 79 - 97 fL   MCH 28.4 26.6 - 33.0 pg   MCHC 33.2 31.5 - 35.7 g/dL   RDW 12.9 11.7 - 15.4 %   Platelets 398 150 - 450 x10E3/uL   Neutrophils 55 Not Estab. %   Lymphs 34 Not Estab. %   Monocytes 7 Not Estab. %   Eos 3 Not Estab. %   Basos 1 Not Estab. %   Neutrophils Absolute 3.5 1.4 - 7.0 x10E3/uL   Lymphocytes Absolute 2.2 0.7 - 3.1 x10E3/uL   Monocytes Absolute 0.4 0.1 - 0.9 x10E3/uL   EOS (ABSOLUTE) 0.2 0.0 - 0.4 x10E3/uL   Basophils Absolute 0.1 0.0 - 0.2 x10E3/uL   Immature Granulocytes 0 Not Estab. %   Immature Grans (Abs) 0.0 0.0 - 0.1 x10E3/uL  Comprehensive metabolic panel  Result Value Ref Range   Glucose 88 65 - 99 mg/dL   BUN 9 6 - 24 mg/dL   Creatinine, Ser 0.87 0.57 - 1.00 mg/dL   eGFR 81 >59 mL/min/1.73   BUN/Creatinine Ratio 10 9 - 23   Sodium 139 134 - 144 mmol/L   Potassium 4.4 3.5 - 5.2 mmol/L   Chloride  103 96 - 106 mmol/L   CO2 19 (L) 20 - 29 mmol/L   Calcium 9.8 8.7 - 10.2 mg/dL   Total Protein 8.3 6.0 - 8.5 g/dL   Albumin 4.6 3.8 - 4.9 g/dL   Globulin, Total 3.7 1.5 - 4.5 g/dL   Albumin/Globulin Ratio 1.2 1.2 - 2.2   Bilirubin Total 0.3 0.0 - 1.2 mg/dL   Alkaline Phosphatase 151 (H) 44 - 121 IU/L   AST 29 0 - 40 IU/L   ALT 26 0 - 32 IU/L  Lipid Panel w/o Chol/HDL Ratio  Result Value Ref Range   Cholesterol, Total 238 (H) 100 - 199 mg/dL   Triglycerides 118 0 - 149 mg/dL   HDL 61 >39 mg/dL   VLDL Cholesterol Cal 21 5 - 40 mg/dL   LDL Chol Calc (NIH) 156 (H) 0 - 99 mg/dL  Urinalysis, Routine w reflex microscopic  Result Value Ref Range   Specific Gravity, UA 1.010 1.005 - 1.030    pH, UA 6.0 5.0 - 7.5   Color, UA Yellow Yellow   Appearance Ur Clear Clear   Leukocytes,UA Negative Negative   Protein,UA Negative Negative/Trace   Glucose, UA Negative Negative   Ketones, UA Negative Negative   RBC, UA Negative Negative   Bilirubin, UA Negative Negative   Urobilinogen, Ur 0.2 0.2 - 1.0 mg/dL   Nitrite, UA Negative Negative  TSH  Result Value Ref Range   TSH 2.780 0.450 - 4.500 uIU/mL  LH  Result Value Ref Range   LH 35.0 mIU/mL  FSH  Result Value Ref Range   FSH 64.3 mIU/mL  Estradiol  Result Value Ref Range   Estradiol <5.0 pg/mL      Assessment & Plan:   Problem List Items Addressed This Visit      Respiratory   Allergic rhinitis   Relevant Medications   loratadine (CLARITIN) 10 MG tablet   montelukast (SINGULAIR) 10 MG tablet     Musculoskeletal and Integument   TMJ (dislocation of temporomandibular joint)   Relevant Medications   tiZANidine (ZANAFLEX) 4 MG tablet     Other   Class 1 obesity due to excess calories without serious comorbidity with body mass index (BMI) of 32.0 to 32.9 in adult    Will start contrave. Call with any concerns. Recheck tolerance in about a month.       Relevant Medications   Naltrexone-buPROPion HCl ER (CONTRAVE) 8-90 MG TB12    Other Visit Diagnoses    Routine general medical examination at a health care facility    -  Primary   Vaccines up to date. Labs done last visit and normal. Colonoscopy and Pap up to date. Mammogram ordered today. Continue diet and exercise. Call with any concern       Follow up plan: Return in about 4 weeks (around 02/12/2021) for follow up contrave.   LABORATORY TESTING:  - Pap smear: up to date  IMMUNIZATIONS:   - Tdap: Tetanus vaccination status reviewed: last tetanus booster within 10 years. - Influenza: Up to date - Pneumovax: Not applicable - COVID: Up to date - Shingrix vaccine: will check with insurance  SCREENING: -Mammogram: Ordered today  - Colonoscopy: Up to  date   PATIENT COUNSELING:   Advised to take 1 mg of folate supplement per day if capable of pregnancy.   Sexuality: Discussed sexually transmitted diseases, partner selection, use of condoms, avoidance of unintended pregnancy  and contraceptive alternatives.   Advised to avoid cigarette smoking.  I discussed with the patient that most people either abstain from alcohol or drink within safe limits (<=14/week and <=4 drinks/occasion for males, <=7/weeks and <= 3 drinks/occasion for females) and that the risk for alcohol disorders and other health effects rises proportionally with the number of drinks per week and how often a drinker exceeds daily limits.  Discussed cessation/primary prevention of drug use and availability of treatment for abuse.   Diet: Encouraged to adjust caloric intake to maintain  or achieve ideal body weight, to reduce intake of dietary saturated fat and total fat, to limit sodium intake by avoiding high sodium foods and not adding table salt, and to maintain adequate dietary potassium and calcium preferably from fresh fruits, vegetables, and low-fat dairy products.    stressed the importance of regular exercise  Injury prevention: Discussed safety belts, safety helmets, smoke detector, smoking near bedding or upholstery.   Dental health: Discussed importance of regular tooth brushing, flossing, and dental visits.    NEXT PREVENTATIVE PHYSICAL DUE IN 1 YEAR. Return in about 4 weeks (around 02/12/2021) for follow up contrave.

## 2021-01-15 NOTE — Patient Instructions (Addendum)
[facility]  Address: [address], [place], [place] [address]  Phone: [phone]    Health Maintenance, Female Adopting a healthy lifestyle and getting preventive care are important in promoting health and wellness. Ask your health care provider about:  The right schedule for you to have regular tests and exams.  Things you can do on your own to prevent diseases and keep yourself healthy. What should I know about diet, weight, and exercise? Eat a healthy diet  Eat a diet that includes plenty of vegetables, fruits, low-fat dairy products, and lean protein.  Do not eat a lot of foods that are high in solid fats, added sugars, or sodium.   Maintain a healthy weight Body mass index (BMI) is used to identify weight problems. It estimates body fat based on height and weight. Your health care provider can help determine your BMI and help you achieve or maintain a healthy weight. Get regular exercise Get regular exercise. This is one of the most important things you can do for your health. Most adults should:  Exercise for at least 150 minutes each week. The exercise should increase your heart rate and make you sweat (moderate-intensity exercise).  Do strengthening exercises at least twice a week. This is in addition to the moderate-intensity exercise.  Spend less time sitting. Even light physical activity can be beneficial. Watch cholesterol and blood lipids Have your blood tested for lipids and cholesterol at 51 years of age, then have this test every 5 years. Have your cholesterol levels checked more often if:  Your lipid or cholesterol levels are high.  You are older than 51 years of age.  You are at high risk for heart disease. What should I know about cancer screening? Depending on your health history and family history, you may need to have cancer screening at various ages. This may include screening for:  Breast cancer.  Cervical  cancer.  Colorectal cancer.  Skin cancer.  Lung cancer. What should I know about heart disease, diabetes, and high blood pressure? Blood pressure and heart disease  High blood pressure causes heart disease and increases the risk of stroke. This is more likely to develop in people who have high blood pressure readings, are of African descent, or are overweight.  Have your blood pressure checked: ? Every 3-5 years if you are 18-39 years of age. ? Every year if you are 40 years old or older. Diabetes Have regular diabetes screenings. This checks your fasting blood sugar level. Have the screening done:  Once every three years after age 40 if you are at a normal weight and have a low risk for diabetes.  More often and at a younger age if you are overweight or have a high risk for diabetes. What should I know about preventing infection? Hepatitis B If you have a higher risk for hepatitis B, you should be screened for this virus. Talk with your health care provider to find out if you are at risk for hepatitis B infection. Hepatitis C Testing is recommended for:  Everyone born from 1945 through 1965.  Anyone with known risk factors for hepatitis C. Sexually transmitted infections (STIs)  Get screened for STIs, including gonorrhea and chlamydia, if: ? You are sexually active and are younger than 51 years of age. ? You are older than 51 years of age and your health care provider tells you that you are at risk for this type of infection. ? Your sexual   activity has changed since you were last screened, and you are at increased risk for chlamydia or gonorrhea. Ask your health care provider if you are at risk.  Ask your health care provider about whether you are at high risk for HIV. Your health care provider may recommend a prescription medicine to help prevent HIV infection. If you choose to take medicine to prevent HIV, you should first get tested for HIV. You should then be tested every 3  months for as long as you are taking the medicine. Pregnancy  If you are about to stop having your period (premenopausal) and you may become pregnant, seek counseling before you get pregnant.  Take 400 to 800 micrograms (mcg) of folic acid every day if you become pregnant.  Ask for birth control (contraception) if you want to prevent pregnancy. Osteoporosis and menopause Osteoporosis is a disease in which the bones lose minerals and strength with aging. This can result in bone fractures. If you are 65 years old or older, or if you are at risk for osteoporosis and fractures, ask your health care provider if you should:  Be screened for bone loss.  Take a calcium or vitamin D supplement to lower your risk of fractures.  Be given hormone replacement therapy (HRT) to treat symptoms of menopause. Follow these instructions at home: Lifestyle  Do not use any products that contain nicotine or tobacco, such as cigarettes, e-cigarettes, and chewing tobacco. If you need help quitting, ask your health care provider.  Do not use street drugs.  Do not share needles.  Ask your health care provider for help if you need support or information about quitting drugs. Alcohol use  Do not drink alcohol if: ? Your health care provider tells you not to drink. ? You are pregnant, may be pregnant, or are planning to become pregnant.  If you drink alcohol: ? Limit how much you use to 0-1 drink a day. ? Limit intake if you are breastfeeding.  Be aware of how much alcohol is in your drink. In the [place] one drink equals one 12 oz bottle of beer (355 mL), one 5 oz glass of wine (148 mL), or one 1 oz glass of hard liquor (44 mL). General instructions  Schedule regular health, dental, and eye exams.  Stay current with your vaccines.  Tell your health care provider if: ? You often feel depressed. ? You have ever been abused or do not feel safe at home. Summary  Adopting a healthy lifestyle and getting  preventive care are important in promoting health and wellness.  Follow your health care provider's instructions about healthy diet, exercising, and getting tested or screened for diseases.  Follow your health care provider's instructions on monitoring your cholesterol and blood pressure. This information is not intended to replace advice given to you by your health care provider. Make sure you discuss any questions you have with your health care provider. Document Revised: 08/02/2018 Document Reviewed: 08/02/2018 Elsevier Patient Education  2021 Elsevier Inc.  

## 2021-01-15 NOTE — Assessment & Plan Note (Signed)
Will start contrave. Call with any concerns. Recheck tolerance in about a month.

## 2021-02-10 ENCOUNTER — Ambulatory Visit: Payer: PRIVATE HEALTH INSURANCE | Admitting: Family Medicine

## 2021-02-18 ENCOUNTER — Telehealth: Payer: Self-pay | Admitting: Family Medicine

## 2021-02-18 NOTE — Telephone Encounter (Signed)
Patient wanted to inform PCP she has not started the weigh loss medication. Patient states she will start taking buPROPion (WELLBUTRIN SR) 150 MG 12 hr tablet and wanted to make PCP aware.

## 2021-02-19 ENCOUNTER — Ambulatory Visit: Payer: PRIVATE HEALTH INSURANCE | Admitting: Family Medicine

## 2021-05-11 ENCOUNTER — Ambulatory Visit: Payer: Self-pay | Admitting: *Deleted

## 2021-05-11 NOTE — Telephone Encounter (Signed)
Call to patient: Patient is wanting to restart antidepressive medication- patient states she under pressure at school. She states she is fine for appointment tomorrow-( No suicidal thoughts) she disconnected call at end of triage- seemed bothered by questions- may have been at work. Patient has appointment tomorrow. Summary: CALL BACK 2nd   Pt has called back, NT busy, pls call pt back at [phone]   ----- Message from Crist Infante sent at 05/11/2021 11:03 AM EDT -----  Pt called to ask if I could send a message to her dr to ask if she could go back on Wellbutrin. I asked if she was having symptoms.  She just said "yes" and got quiet. I did no feel she wanted to share with me what sx she was having that she wants to go back on this med.  I did make her appt for 4 pm tomorrow.      Reason for Disposition  [1] Depression AND [2] worsening (e.g.,sleeping poorly, less able to do activities of daily living)  Answer Assessment - Initial Assessment Questions 1. CONCERN: "What happened that made you call today?"     School stress 2. DEPRESSION SYMPTOM SCREENING: "How are you feeling overall?" (e.g., decreased energy, increased sleeping or difficulty sleeping, difficulty concentrating, feelings of sadness, guilt, hopelessness, or worthlessness)     Difficult time sleeping 3. RISK OF HARM - SUICIDAL IDEATION:  "Do you ever have thoughts of hurting or killing yourself?"  (e.g., yes, no, no but preoccupation with thoughts about death)   - INTENT:  "Do you have thoughts of hurting or killing yourself right NOW?" (e.g., yes, no, N/A)   - PLAN: "Do you have a specific plan for how you would do this?" (e.g., gun, knife, overdose, no plan, N/A)     no 4. RISK OF HARM - HOMICIDAL IDEATION:  "Do you ever have thoughts of hurting or killing someone else?"  (e.g., yes, no, no but preoccupation with thoughts about death)   - INTENT:  "Do you have thoughts of hurting or killing someone right NOW?" (e.g., yes,  no, N/A)   - PLAN: "Do you have a specific plan for how you would do this?" (e.g., gun, knife, no plan, N/A)      no 5. FUNCTIONAL IMPAIRMENT: "How have things been going for you overall? Have you had more difficulty than usual doing your normal daily activities?"  (e.g., better, same, worse; self-care, school, work, Curator)     Life outside of school- A little more difficulty with daily activity 6. SUPPORT: "Who is with you now?" "Who do you live with?" "Do you have family or friends who you can talk to?"      Good support network- knows to reach out for help if crisis 7. THERAPIST: "Do you have a counselor or therapist? Name?"     No 8. STRESSORS: "Has there been any new stress or recent changes in your life?"     no 9. ALCOHOL USE OR SUBSTANCE USE (DRUG USE): "Do you drink alcohol or use any illegal drugs?"     Stopped alcohol 10. OTHER: "Do you have any other physical symptoms right now?" (e.g., fever)       No 11. PREGNANCY: "Is there any chance you are pregnant?" "When was your last menstrual period?"       Not asked  Protocols used: Depression-A-AH

## 2021-05-12 ENCOUNTER — Ambulatory Visit (INDEPENDENT_AMBULATORY_CARE_PROVIDER_SITE_OTHER): Payer: PRIVATE HEALTH INSURANCE | Admitting: Family Medicine

## 2021-05-12 ENCOUNTER — Encounter: Payer: Self-pay | Admitting: Family Medicine

## 2021-05-12 ENCOUNTER — Other Ambulatory Visit: Payer: Self-pay

## 2021-05-12 VITALS — BP 132/89 | HR 87 | Temp 99.5°F | Wt 206.6 lb

## 2021-05-12 DIAGNOSIS — E6609 Other obesity due to excess calories: Secondary | ICD-10-CM | POA: Diagnosis not present

## 2021-05-12 DIAGNOSIS — Z6832 Body mass index (BMI) 32.0-32.9, adult: Secondary | ICD-10-CM

## 2021-05-12 DIAGNOSIS — Z1231 Encounter for screening mammogram for malignant neoplasm of breast: Secondary | ICD-10-CM

## 2021-05-12 MED ORDER — BUPROPION HCL ER (SR) 150 MG PO TB12: ORAL_TABLET | ORAL | 3 refills | Status: DC

## 2021-05-12 NOTE — Assessment & Plan Note (Signed)
Would like to restart her wellbutrin. Rx sent to her pharmacy. Recheck 3 months. Call with any concerns.

## 2021-05-12 NOTE — Patient Instructions (Signed)
Please call [facility] at [phone] to schedule your mammogram.

## 2021-05-12 NOTE — Progress Notes (Signed)
BP 132/89 (BP Location: Left Arm, Cuff Size: Large)   Pulse 87   Temp 99.5 F (37.5 C) (Oral)   Wt 206 lb 9.6 oz (93.7 kg)   SpO2 99%   BMI 33.86 kg/m    Subjective:    Patient ID: Renee Parsons, female    DOB: 18-Nov-1970, 50 y.o.   MRN: [id number]  HPI: Renee Parsons is a 50 y.o. female  Chief Complaint  Patient presents with   Obesity   WEIGHT GAIN Duration: chronic Previous attempts at weight loss: yes- watching carbs and exercising 30 minutes Complications of obesity: candidal interigo Peak weight: 209 Weight loss goal: to be healthy  Weight loss to date: 3 lbs Requesting obesity pharmacotherapy: yes Current weight loss supplements/medications: no Previous weight loss supplements/meds: yes- contrave  Relevant past medical, surgical, family and social history reviewed and updated as indicated. Interim medical history since our last visit reviewed. Allergies and medications reviewed and updated.  Review of Systems  Constitutional: Negative.   Respiratory: Negative.    Cardiovascular: Negative.   Gastrointestinal: Negative.   Musculoskeletal: Negative.   Psychiatric/Behavioral: Negative.     Per HPI unless specifically indicated above     Objective:    BP 132/89 (BP Location: Left Arm, Cuff Size: Large)   Pulse 87   Temp 99.5 F (37.5 C) (Oral)   Wt 206 lb 9.6 oz (93.7 kg)   SpO2 99%   BMI 33.86 kg/m   Wt Readings from Last 3 Encounters:  05/12/21 206 lb 9.6 oz (93.7 kg)  01/15/21 208 lb (94.3 kg)  12/02/20 209 lb 3.2 oz (94.9 kg)    Physical Exam Vitals and nursing note reviewed.  Constitutional:      General: She is not in acute distress.    Appearance: Normal appearance. She is not ill-appearing, toxic-appearing or diaphoretic.  HENT:     Head: Normocephalic and atraumatic.     Right Ear: External ear normal.     Left Ear: External ear normal.     Nose: Nose normal.     Mouth/Throat:     Mouth: Mucous membranes are moist.     Pharynx:  Oropharynx is clear.  Eyes:     General: No scleral icterus.       Right eye: No discharge.        Left eye: No discharge.     Extraocular Movements: Extraocular movements intact.     Conjunctiva/sclera: Conjunctivae normal.     Pupils: Pupils are equal, round, and reactive to light.  Cardiovascular:     Rate and Rhythm: Normal rate and regular rhythm.     Pulses: Normal pulses.     Heart sounds: Normal heart sounds. No murmur heard.   No friction rub. No gallop.  Pulmonary:     Effort: Pulmonary effort is normal. No respiratory distress.     Breath sounds: Normal breath sounds. No stridor. No wheezing, rhonchi or rales.  Chest:     Chest wall: No tenderness.  Musculoskeletal:        General: Normal range of motion.     Cervical back: Normal range of motion and neck supple.  Skin:    General: Skin is warm and dry.     Capillary Refill: Capillary refill takes less than 2 seconds.     Coloration: Skin is not jaundiced or pale.     Findings: No bruising, erythema, lesion or rash.  Neurological:     General: No focal deficit present.  Mental Status: She is alert and oriented to person, place, and time. Mental status is at baseline.  Psychiatric:        Mood and Affect: Mood normal.        Behavior: Behavior normal.        Thought Content: Thought content normal.        Judgment: Judgment normal.    Results for orders placed or performed in visit on 12/02/20  CBC with Differential/Platelet  Result Value Ref Range   WBC 6.4 3.4 - 10.8 x10E3/uL   RBC 4.93 3.77 - 5.28 x10E6/uL   Hemoglobin 14.0 11.1 - 15.9 g/dL   Hematocrit 42.2 34.0 - 46.6 %   MCV 86 79 - 97 fL   MCH 28.4 26.6 - 33.0 pg   MCHC 33.2 31.5 - 35.7 g/dL   RDW 12.9 11.7 - 15.4 %   Platelets 398 150 - 450 x10E3/uL   Neutrophils 55 Not Estab. %   Lymphs 34 Not Estab. %   Monocytes 7 Not Estab. %   Eos 3 Not Estab. %   Basos 1 Not Estab. %   Neutrophils Absolute 3.5 1.4 - 7.0 x10E3/uL   Lymphocytes Absolute  2.2 0.7 - 3.1 x10E3/uL   Monocytes Absolute 0.4 0.1 - 0.9 x10E3/uL   EOS (ABSOLUTE) 0.2 0.0 - 0.4 x10E3/uL   Basophils Absolute 0.1 0.0 - 0.2 x10E3/uL   Immature Granulocytes 0 Not Estab. %   Immature Grans (Abs) 0.0 0.0 - 0.1 x10E3/uL  Comprehensive metabolic panel  Result Value Ref Range   Glucose 88 65 - 99 mg/dL   BUN 9 6 - 24 mg/dL   Creatinine, Ser 0.87 0.57 - 1.00 mg/dL   eGFR 81 >59 mL/min/1.73   BUN/Creatinine Ratio 10 9 - 23   Sodium 139 134 - 144 mmol/L   Potassium 4.4 3.5 - 5.2 mmol/L   Chloride 103 96 - 106 mmol/L   CO2 19 (L) 20 - 29 mmol/L   Calcium 9.8 8.7 - 10.2 mg/dL   Total Protein 8.3 6.0 - 8.5 g/dL   Albumin 4.6 3.8 - 4.9 g/dL   Globulin, Total 3.7 1.5 - 4.5 g/dL   Albumin/Globulin Ratio 1.2 1.2 - 2.2   Bilirubin Total 0.3 0.0 - 1.2 mg/dL   Alkaline Phosphatase 151 (H) 44 - 121 IU/L   AST 29 0 - 40 IU/L   ALT 26 0 - 32 IU/L  Lipid Panel w/o Chol/HDL Ratio  Result Value Ref Range   Cholesterol, Total 238 (H) 100 - 199 mg/dL   Triglycerides 118 0 - 149 mg/dL   HDL 61 >39 mg/dL   VLDL Cholesterol Cal 21 5 - 40 mg/dL   LDL Chol Calc (NIH) 156 (H) 0 - 99 mg/dL  Urinalysis, Routine w reflex microscopic  Result Value Ref Range   Specific Gravity, UA 1.010 1.005 - 1.030   pH, UA 6.0 5.0 - 7.5   Color, UA Yellow Yellow   Appearance Ur Clear Clear   Leukocytes,UA Negative Negative   Protein,UA Negative Negative/Trace   Glucose, UA Negative Negative   Ketones, UA Negative Negative   RBC, UA Negative Negative   Bilirubin, UA Negative Negative   Urobilinogen, Ur 0.2 0.2 - 1.0 mg/dL   Nitrite, UA Negative Negative  TSH  Result Value Ref Range   TSH 2.780 0.450 - 4.500 uIU/mL  LH  Result Value Ref Range   LH 35.0 mIU/mL  [facility]  Result Value Ref Range   FSH 64.3 mIU/mL  Estradiol  Result Value Ref Range   Estradiol <5.0 pg/mL      Assessment & Plan:   Problem List Items Addressed This Visit       Other   Class 1 obesity due to excess calories  without serious comorbidity with body mass index (BMI) of 32.0 to 32.9 in adult - Primary    Would like to restart her wellbutrin. Rx sent to her pharmacy. Recheck 3 months. Call with any concerns.       Other Visit Diagnoses     Encounter for screening mammogram for malignant neoplasm of breast       Relevant Orders   MM 3D SCREEN BREAST BILATERAL        Follow up plan: Return in about 3 months (around 08/11/2021).

## 2021-08-06 ENCOUNTER — Ambulatory Visit: Payer: Self-pay | Admitting: Family Medicine

## 2021-08-26 ENCOUNTER — Ambulatory Visit: Payer: No Typology Code available for payment source | Admitting: Family Medicine

## 2021-11-26 ENCOUNTER — Encounter: Payer: Self-pay | Admitting: Family Medicine

## 2021-11-26 ENCOUNTER — Ambulatory Visit (INDEPENDENT_AMBULATORY_CARE_PROVIDER_SITE_OTHER): Payer: PRIVATE HEALTH INSURANCE | Admitting: Family Medicine

## 2021-11-26 VITALS — HR 73 | Wt 204.6 lb

## 2021-11-26 DIAGNOSIS — E6609 Other obesity due to excess calories: Secondary | ICD-10-CM | POA: Diagnosis not present

## 2021-11-26 DIAGNOSIS — Z6833 Body mass index (BMI) 33.0-33.9, adult: Secondary | ICD-10-CM

## 2021-11-26 DIAGNOSIS — R232 Flushing: Secondary | ICD-10-CM | POA: Diagnosis not present

## 2021-11-26 DIAGNOSIS — M722 Plantar fascial fibromatosis: Secondary | ICD-10-CM

## 2021-11-26 MED ORDER — BUPROPION HCL ER (SR) 150 MG PO TB12: ORAL_TABLET | ORAL | 3 refills | Status: DC

## 2021-11-26 MED ORDER — NYSTATIN 100000 UNIT/GM EX POWD: 1.0000 "application " | Freq: Three times a day (TID) | CUTANEOUS | 1 refills | Status: DC

## 2021-11-26 MED ORDER — GABAPENTIN 100 MG PO CAPS: 100.0000 mg | ORAL_CAPSULE | Freq: Three times a day (TID) | ORAL | 3 refills | Status: DC

## 2021-11-26 NOTE — Assessment & Plan Note (Addendum)
Has only been taking 1 of her wellbutrin a day. Will get her up to 2 BID and recheck in about 6 weeks. Continue to monitor. Call with any concerns. Goal of losing 1-2lbs per week.  ?

## 2021-11-26 NOTE — Assessment & Plan Note (Signed)
Will start gabapentin. Call with any concerns. Recheck about 6 weeks. ?

## 2021-11-26 NOTE — Progress Notes (Signed)
? ?Pulse 73   Wt 204 lb 9.6 oz (92.8 kg)   SpO2 99%   BMI 33.53 kg/m?   ? ?Subjective:  ? ? Patient ID: Renee Parsons, female    DOB: [birth year], 51 y.o.   MRN: [id number] ? ?HPI: ?Renee Parsons is a 51 y.o. female ? ?Chief Complaint  ?Patient presents with  ? Obesity  ?  Patient states she wants would like some help with loosing weight, states she is watching what she eats but is not loosing much. Patient states her hormones are out of wack and feels like she can't loose weight.   ? foot concern  ?  Patient states it is painful when she puts pressure on her left foot, specifically the heel   ? ?OBESITY ?Duration: chronic ?Previous attempts at weight loss: yes ?Complications of obesity: none ?Peak weight: 209 ?Weight loss goal: to be healthy  ?Weight loss to date: 5lbs ?Requesting obesity pharmacotherapy: yes ?Current weight loss supplements/medications: no ?Previous weight loss supplements/meds: no ? ?MENOPAUSAL SYMPTOMS ?Duration: uncontrolled ?Symptom severity: severe ?Hot flashes: yes ?Night sweats: yes ?Sleep disturbances: no ?Vaginal dryness: no ?Dyspareunia:no ?Decreased libido: no ?Emotional lability: no ?Stress incontinence: no ?Previous HRT/pharmacotherapy: no ?Hysterectomy: no ?Absolute Contraindications to Hormonal Therapy:  ?   Undiagnosed vaginal bleeding: no ?   Breast cancer: no ?   Endometrial cancer: no ?   Coronary disease: no ?   Cerebrovascular disease: no ?   Venous thromboembolic disease: no ? ?FOOT PAIN ?Duration: chronic ?Involved foot: left ?Mechanism of injury: unknown ?Location: heel ?Onset: gradual  ?Severity: severe  ?Quality:  sharp ?Frequency: constant ?Radiation: no ?Aggravating factors: weight bearing and walking  ?Alleviating factors: nothing  ?Status: worse ?Treatments attempted: rest, ice, heat, APAP, ibuprofen, aleve, and HEP  ?Relief with NSAIDs?:  mild ?Weakness with weight bearing or walking: no ?Morning stiffness: no ?Swelling: no ?Redness: no ?Bruising:  no ?Paresthesias / decreased sensation: no  ?Fevers:no ? ?Relevant past medical, surgical, family and social history reviewed and updated as indicated. Interim medical history since our last visit reviewed. ?Allergies and medications reviewed and updated. ? ?Review of Systems  ?Constitutional: Negative.   ?Respiratory: Negative.    ?Cardiovascular: Negative.   ?Gastrointestinal: Negative.   ?Musculoskeletal:  Positive for arthralgias and myalgias. Negative for back pain, gait problem, joint swelling, neck pain and neck stiffness.  ?Skin: Negative.   ?Neurological: Negative.   ?Psychiatric/Behavioral: Negative.    ? ?Per HPI unless specifically indicated above ? ?   ?Objective:  ?  ?Pulse 73   Wt 204 lb 9.6 oz (92.8 kg)   SpO2 99%   BMI 33.53 kg/m?   ?Wt Readings from Last 3 Encounters:  ?11/26/21 204 lb 9.6 oz (92.8 kg)  ?05/12/21 206 lb 9.6 oz (93.7 kg)  ?01/15/21 208 lb (94.3 kg)  ?  ?Physical Exam ?Vitals and nursing note reviewed.  ?Constitutional:   ?   General: She is not in acute distress. ?   Appearance: Normal appearance. She is not ill-appearing, toxic-appearing or diaphoretic.  ?HENT:  ?   Head: Normocephalic and atraumatic.  ?   Right Ear: External ear normal.  ?   Left Ear: External ear normal.  ?   Nose: Nose normal.  ?   Mouth/Throat:  ?   Mouth: Mucous membranes are moist.  ?   Pharynx: Oropharynx is clear.  ?Eyes:  ?   General: No scleral icterus.    ?   Right eye: No discharge.     ?  Left eye: No discharge.  ?   Extraocular Movements: Extraocular movements intact.  ?   Conjunctiva/sclera: Conjunctivae normal.  ?   Pupils: Pupils are equal, round, and reactive to light.  ?Cardiovascular:  ?   Rate and Rhythm: Normal rate and regular rhythm.  ?   Pulses: Normal pulses.  ?   Heart sounds: Normal heart sounds. No murmur heard. ?  No friction rub. No gallop.  ?Pulmonary:  ?   Effort: Pulmonary effort is normal. No respiratory distress.  ?   Breath sounds: Normal breath sounds. No stridor. No  wheezing, rhonchi or rales.  ?Chest:  ?   Chest wall: No tenderness.  ?Musculoskeletal:     ?   General: Normal range of motion.  ?   Cervical back: Normal range of motion and neck supple.  ?Skin: ?   General: Skin is warm and dry.  ?   Capillary Refill: Capillary refill takes less than 2 seconds.  ?   Coloration: Skin is not jaundiced or pale.  ?   Findings: No bruising, erythema, lesion or rash.  ?Neurological:  ?   General: No focal deficit present.  ?   Mental Status: She is alert and oriented to person, place, and time. Mental status is at baseline.  ?Psychiatric:     ?   Mood and Affect: Mood normal.     ?   Behavior: Behavior normal.     ?   Thought Content: Thought content normal.     ?   Judgment: Judgment normal.  ? ? ?Results for orders placed or performed in visit on 12/02/20  ?CBC with Differential/Platelet  ?Result Value Ref Range  ? WBC 6.4 3.4 - 10.8 x10E3/uL  ? RBC 4.93 3.77 - 5.28 x10E6/uL  ? Hemoglobin 14.0 11.1 - 15.9 g/dL  ? Hematocrit 42.2 34.0 - 46.6 %  ? MCV 86 79 - 97 fL  ? MCH 28.4 26.6 - 33.0 pg  ? MCHC 33.2 31.5 - 35.7 g/dL  ? RDW 12.9 11.7 - 15.4 %  ? Platelets 398 150 - 450 x10E3/uL  ? Neutrophils 55 Not Estab. %  ? Lymphs 34 Not Estab. %  ? Monocytes 7 Not Estab. %  ? Eos 3 Not Estab. %  ? Basos 1 Not Estab. %  ? Neutrophils Absolute 3.5 1.4 - 7.0 x10E3/uL  ? Lymphocytes Absolute 2.2 0.7 - 3.1 x10E3/uL  ? Monocytes Absolute 0.4 0.1 - 0.9 x10E3/uL  ? EOS (ABSOLUTE) 0.2 0.0 - 0.4 x10E3/uL  ? Basophils Absolute 0.1 0.0 - 0.2 x10E3/uL  ? Immature Granulocytes 0 Not Estab. %  ? Immature Grans (Abs) 0.0 0.0 - 0.1 x10E3/uL  ?Comprehensive metabolic panel  ?Result Value Ref Range  ? Glucose 88 65 - 99 mg/dL  ? BUN 9 6 - 24 mg/dL  ? Creatinine, Ser 0.87 0.57 - 1.00 mg/dL  ? eGFR 81 >59 mL/min/1.73  ? BUN/Creatinine Ratio 10 9 - 23  ? Sodium 139 134 - 144 mmol/L  ? Potassium 4.4 3.5 - 5.2 mmol/L  ? Chloride 103 96 - 106 mmol/L  ? CO2 19 (L) 20 - 29 mmol/L  ? Calcium 9.8 8.7 - 10.2 mg/dL  ? Total  Protein 8.3 6.0 - 8.5 g/dL  ? Albumin 4.6 3.8 - 4.9 g/dL  ? Globulin, Total 3.7 1.5 - 4.5 g/dL  ? Albumin/Globulin Ratio 1.2 1.2 - 2.2  ? Bilirubin Total 0.3 0.0 - 1.2 mg/dL  ? Alkaline Phosphatase 151 (H) 44 - 121 IU/L  ?  AST 29 0 - 40 IU/L  ? ALT 26 0 - 32 IU/L  ?Lipid Panel w/o Chol/HDL Ratio  ?Result Value Ref Range  ? Cholesterol, Total 238 (H) 100 - 199 mg/dL  ? Triglycerides 118 0 - 149 mg/dL  ? HDL 61 >39 mg/dL  ? VLDL Cholesterol Cal 21 5 - 40 mg/dL  ? LDL Chol Calc (NIH) 156 (H) 0 - 99 mg/dL  ?Urinalysis, Routine w reflex microscopic  ?Result Value Ref Range  ? Specific Gravity, UA 1.010 1.005 - 1.030  ? pH, UA 6.0 5.0 - 7.5  ? Color, UA Yellow Yellow  ? Appearance Ur Clear Clear  ? Leukocytes,UA Negative Negative  ? Protein,UA Negative Negative/Trace  ? Glucose, UA Negative Negative  ? Ketones, UA Negative Negative  ? RBC, UA Negative Negative  ? Bilirubin, UA Negative Negative  ? Urobilinogen, Ur 0.2 0.2 - 1.0 mg/dL  ? Nitrite, UA Negative Negative  ?TSH  ?Result Value Ref Range  ? TSH 2.780 0.450 - 4.500 uIU/mL  ?LH  ?Result Value Ref Range  ? LH 35.0 mIU/mL  ?[place]  ?Result Value Ref Range  ? FSH 64.3 mIU/mL  ?Estradiol  ?Result Value Ref Range  ? Estradiol <5.0 pg/mL  ? ?   ?Assessment & Plan:  ? ?Problem List Items Addressed This Visit   ? ?  ? Cardiovascular and Mediastinum  ? Hot flashes  ?  Will start gabapentin. Call with any concerns. Recheck about 6 weeks. ?  ?  ?  ? Other  ? Class 1 obesity due to excess calories without serious comorbidity with body mass index (BMI) of 32.0 to 32.9 in adult  ?  Has only been taking 1 of her wellbutrin a day. Will get her up to 2 BID and recheck in about 6 weeks. Continue to monitor. Call with any concerns. Goal of losing 1-2lbs per week.  ?  ?  ? ?Other Visit Diagnoses   ? ? Plantar fasciitis of left foot    -  Primary  ? Chronic and worsening. Will get her into podiatry. Call with any concerns.   ? Relevant Orders  ? Ambulatory referral to Podiatry  ? ?  ?   ? ?Follow up plan: ?Return After 5/26 for physical. ? ? ? ? ? ?

## 2021-12-08 ENCOUNTER — Ambulatory Visit (INDEPENDENT_AMBULATORY_CARE_PROVIDER_SITE_OTHER): Payer: No Typology Code available for payment source

## 2021-12-08 ENCOUNTER — Ambulatory Visit (INDEPENDENT_AMBULATORY_CARE_PROVIDER_SITE_OTHER): Payer: No Typology Code available for payment source | Admitting: Podiatry

## 2021-12-08 DIAGNOSIS — M722 Plantar fascial fibromatosis: Secondary | ICD-10-CM | POA: Diagnosis not present

## 2021-12-08 MED ORDER — MELOXICAM 15 MG PO TABS: 15.0000 mg | ORAL_TABLET | Freq: Every day | ORAL | 1 refills | Status: DC

## 2021-12-08 MED ORDER — METHYLPREDNISOLONE 4 MG PO TBPK: ORAL_TABLET | ORAL | 0 refills | Status: DC

## 2021-12-24 ENCOUNTER — Encounter: Payer: Self-pay | Admitting: Family Medicine

## 2021-12-27 DIAGNOSIS — M722 Plantar fascial fibromatosis: Secondary | ICD-10-CM | POA: Diagnosis not present

## 2021-12-27 MED ORDER — BETAMETHASONE SOD PHOS & ACET 6 (3-3) MG/ML IJ SUSP
3.0000 mg | Freq: Once | INTRAMUSCULAR | Status: AC
  Administered 2021-12-27: 3 mg via INTRA_ARTICULAR

## 2021-12-27 NOTE — Progress Notes (Signed)
? ?  Subjective: ?51 y.o. female presenting today as a new patient for evaluation of left heel pain has been going on about 2-3 years.  She works on her feet all day long.  Patient reports pain and tenderness for a few years now.  Currently she has not done anything for treatment ? ? ?Past Medical History:  ?Diagnosis Date  ? Chronic back pain   ? ?Past Surgical History:  ?Procedure Laterality Date  ? COLONOSCOPY WITH PROPOFOL N/A 12/06/2019  ? Procedure: COLONOSCOPY WITH BIOPSY;  Surgeon: Toney Reil, MD;  Location: [facility];  Service: Endoscopy;  Laterality: N/A;  priority 4  ? POLYPECTOMY N/A 12/06/2019  ? Procedure: POLYPECTOMY;  Surgeon: Toney Reil, MD;  Location: [facility];  Service: Endoscopy;  Laterality: N/A;  ? TOTAL ABDOMINAL HYSTERECTOMY W/ BILATERAL SALPINGOOPHORECTOMY    ? ?No Known Allergies ? ? ? ?Objective: ?Physical Exam ?General: The patient is alert and oriented x3 in no acute distress. ? ?Dermatology: Skin is warm, dry and supple bilateral lower extremities. Negative for open lesions or macerations bilateral.  ? ?Vascular: Dorsalis Pedis and Posterior Tibial pulses palpable bilateral.  Capillary fill time is immediate to all digits. ? ?Neurological: Epicritic and protective threshold intact bilateral.  ? ?Musculoskeletal: Tenderness to palpation to the plantar aspect of the left heel along the plantar fascia. All other joints range of motion within normal limits bilateral. Strength 5/5 in all groups bilateral.  ? ?Radiographic exam: Normal osseous mineralization. Joint spaces preserved. No fracture/dislocation/boney destruction. No other soft tissue abnormalities or radiopaque foreign bodies.  ? ?Assessment: ?1. Plantar fasciitis left foot ? ?Plan of Care:  ?1. Patient evaluated. Xrays reviewed.   ?2. Injection of 0.5cc Celestone soluspan injected into the left plantar fascia.  ?3. Rx for Medrol Dose Pak placed ?4. Rx for Meloxicam ordered for patient. ?5.  Plantar fascial band(s) dispensed  ?6. Instructed patient regarding therapies and modalities at home to alleviate symptoms.  ?7. Return to clinic in 4 weeks.   ? ?*Engineer, technical sales ? ? ?Felecia Shelling, DPM ?[facility] ? ?Dr. Felecia Shelling, DPM  ?  ?[address]                                     ?[place], [place] [address]                ?Office [phone]  ?Fax [phone] ? ? ? ? ?

## 2022-01-08 ENCOUNTER — Ambulatory Visit: Payer: No Typology Code available for payment source | Admitting: Podiatry

## 2022-01-25 ENCOUNTER — Ambulatory Visit: Payer: No Typology Code available for payment source | Admitting: Family Medicine

## 2022-02-02 ENCOUNTER — Ambulatory Visit: Payer: No Typology Code available for payment source | Admitting: Family Medicine

## 2023-01-19 ENCOUNTER — Ambulatory Visit (INDEPENDENT_AMBULATORY_CARE_PROVIDER_SITE_OTHER): Payer: PRIVATE HEALTH INSURANCE | Admitting: Nurse Practitioner

## 2023-01-19 ENCOUNTER — Encounter: Payer: Self-pay | Admitting: Nurse Practitioner

## 2023-01-19 VITALS — BP 139/85 | HR 80 | Temp 98.4°F | Ht 65.51 in | Wt 208.2 lb

## 2023-01-19 DIAGNOSIS — J3089 Other allergic rhinitis: Secondary | ICD-10-CM | POA: Diagnosis not present

## 2023-01-19 DIAGNOSIS — J029 Acute pharyngitis, unspecified: Secondary | ICD-10-CM | POA: Diagnosis not present

## 2023-01-19 MED ORDER — PREDNISONE 20 MG PO TABS: 40.0000 mg | ORAL_TABLET | Freq: Every day | ORAL | 0 refills | Status: AC

## 2023-01-19 MED ORDER — AMOXICILLIN-POT CLAVULANATE 875-125 MG PO TABS: 1.0000 | ORAL_TABLET | Freq: Two times a day (BID) | ORAL | 0 refills | Status: AC

## 2023-01-19 MED ORDER — FLUTICASONE PROPIONATE 50 MCG/ACT NA SUSP: 2.0000 | Freq: Every day | NASAL | 6 refills | Status: DC

## 2023-01-19 NOTE — Progress Notes (Signed)
BP 139/85   Pulse 80   Temp 98.4 F (36.9 C) (Oral)   Ht 5' 5.51" (1.664 m)   Wt 208 lb 3.2 oz (94.4 kg)   SpO2 99%   BMI 34.11 kg/m    Subjective:    Patient ID: Renee Parsons, female    DOB: [birth year], 52 y.o.   MRN: [id number]  HPI: Renee Parsons is a 52 y.o. female  Chief Complaint  Patient presents with   Sore Throat    For 3 weeks, Patient states that sore throat is better, but needs to clear her throat   SORE THROAT Started out 3 weeks ago, is improving.  Thinks she had a sinus infection initially, had bloody mucus for 2 weeks.  This stopped 2 days ago.  She had been coughing a lot at time + headaches.  Does have seasonal allergies she takes Claritin, Singulair, and Benadryl.  Does not use nasal sprays.  No previous allergy testing.  Used [employer] recently.  Gets bad sinus infections at baseline.  Non smoker -- history of but quit 10 years ago.  Very rare alcohol use.  Has never attended ENT in past.   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 Interim medical history since our last visit reviewed. Allergies and medications reviewed and updated.  Review of Systems  Constitutional:  Negative for activity change, appetite change, chills, fatigue and fever.  HENT:   Positive for congestion, postnasal drip and sore throat. Negative for ear discharge, ear pain, nosebleeds, rhinorrhea, sinus pressure and sinus pain.   Respiratory:  Positive for cough. Negative for chest tightness, shortness of breath and wheezing.   Cardiovascular:  Negative for chest pain, palpitations and leg swelling.  Gastrointestinal: Negative.   Neurological:  Positive for headaches.  Psychiatric/Behavioral: Negative.      Per HPI unless specifically indicated above     Objective:    BP 139/85   Pulse 80   Temp 98.4 F (36.9 C) (Oral)   Ht 5' 5.51" (1.664 m)   Wt 208 lb 3.2 oz (94.4 kg)   SpO2 99%   BMI 34.11 kg/m   Wt Readings from Last 3 Encounters:  01/19/23 208 lb 3.2 oz (94.4 kg)  11/26/21 204 lb 9.6 oz (92.8 kg)  05/12/21 206 lb 9.6 oz (93.7 kg)    Physical Exam Vitals and nursing note reviewed.  Constitutional:      General: She is awake. She is not in acute distress.    Appearance: She is well-developed and well-groomed. She is obese. She is not ill-appearing or toxic-appearing.  HENT:     Head: Normocephalic.     Right Ear: Hearing, ear canal and external ear normal. A middle ear effusion is present. Tympanic membrane is not injected or perforated.     Left Ear: Hearing, ear canal and external ear  normal. A middle ear effusion is present. Tympanic membrane is not injected or perforated.     Nose: Congestion present. No nasal tenderness or rhinorrhea.     Right Turbinates: Swollen.     Left Turbinates: Swollen.     Right Sinus: No maxillary sinus tenderness or frontal sinus tenderness.     Left Sinus: No maxillary sinus tenderness or frontal sinus tenderness.     Mouth/Throat:     Mouth: Mucous membranes are moist.     Pharynx: Posterior oropharyngeal erythema (mild) present. No pharyngeal swelling or oropharyngeal exudate.     Tonsils: No tonsillar exudate. 2+ on the right. 2+ on the left.  Eyes:     General: Lids are normal.        Right eye: No  discharge.        Left eye: No discharge.     Conjunctiva/sclera: Conjunctivae normal.     Pupils: Pupils are equal, round, and reactive to light.  Neck:     Thyroid: No thyromegaly.     Vascular: No carotid bruit.  Cardiovascular:     Rate and Rhythm: Normal rate and regular rhythm.     Heart sounds: Normal heart sounds. No murmur heard.    No gallop.  Pulmonary:     Effort: Pulmonary effort is normal. No accessory muscle usage or respiratory distress.     Breath sounds: No decreased breath sounds, wheezing or rhonchi.  Abdominal:     General: Bowel sounds are normal. There is no distension.     Palpations: Abdomen is soft.     Tenderness: There is no abdominal tenderness.  Musculoskeletal:     Cervical back: Normal range of motion and neck supple.     Right lower leg: No edema.     Left lower leg: No edema.  Lymphadenopathy:     Head:     Right side of head: Submandibular and tonsillar adenopathy present. No submental, preauricular or posterior auricular adenopathy.     Left side of head: Submandibular and tonsillar adenopathy present. No submental, preauricular or posterior auricular adenopathy.     Cervical: No cervical adenopathy.  Skin:    General: Skin is warm and dry.  Neurological:     Mental Status: She is alert and oriented to person, place, and time.  Psychiatric:        Attention and Perception: Attention normal.        Mood and Affect: Mood normal.        Speech: Speech normal.        Behavior: Behavior normal. Behavior is cooperative.        Thought Content: Thought content normal.    Results for orders placed or performed in visit on 12/02/20  CBC with Differential/Platelet  Result Value Ref Range   WBC 6.4 3.4 - 10.8 x10E3/uL   RBC 4.93 3.77 - 5.28 x10E6/uL   Hemoglobin 14.0 11.1 - 15.9 g/dL   Hematocrit 19.1 47.8 - 46.6 %   MCV 86 79 - 97 fL   MCH 28.4 26.6 - 33.0 pg   MCHC 33.2 31.5 - 35.7 g/dL   RDW 29.5 62.1 - 30.8 %   Platelets 398 150 - 450  x10E3/uL   Neutrophils 55 Not Estab. %   Lymphs 34 Not Estab. %   Monocytes 7 Not Estab. %   Eos 3 Not Estab. %   Basos 1 Not Estab. %   Neutrophils Absolute 3.5 1.4 - 7.0 x10E3/uL   Lymphocytes Absolute  2.2 0.7 - 3.1 x10E3/uL   Monocytes Absolute 0.4 0.1 - 0.9 x10E3/uL   EOS (ABSOLUTE) 0.2 0.0 - 0.4 x10E3/uL   Basophils Absolute 0.1 0.0 - 0.2 x10E3/uL   Immature Granulocytes 0 Not Estab. %   Immature Grans (Abs) 0.0 0.0 - 0.1 x10E3/uL  Comprehensive metabolic panel  Result Value Ref Range   Glucose 88 65 - 99 mg/dL   BUN 9 6 - 24 mg/dL   Creatinine, Ser 1.61 0.57 - 1.00 mg/dL   eGFR 81 >09 UE/AVW/0.98   BUN/Creatinine Ratio 10 9 - 23   Sodium 139 134 - 144 mmol/L   Potassium 4.4 3.5 - 5.2 mmol/L   Chloride 103 96 - 106 mmol/L   CO2 19 (L) 20 - 29 mmol/L   Calcium 9.8 8.7 - 10.2 mg/dL   Total Protein 8.3 6.0 - 8.5 g/dL   Albumin 4.6 3.8 - 4.9 g/dL   Globulin, Total 3.7 1.5 - 4.5 g/dL   Albumin/Globulin Ratio 1.2 1.2 - 2.2   Bilirubin Total 0.3 0.0 - 1.2 mg/dL   Alkaline Phosphatase 151 (H) 44 - 121 IU/L   AST 29 0 - 40 IU/L   ALT 26 0 - 32 IU/L  Lipid Panel w/o Chol/HDL Ratio  Result Value Ref Range   Cholesterol, Total 238 (H) 100 - 199 mg/dL   Triglycerides 119 0 - 149 mg/dL   HDL 61 >14 mg/dL   VLDL Cholesterol Cal 21 5 - 40 mg/dL   LDL Chol Calc (NIH) 782 (H) 0 - 99 mg/dL  Urinalysis, Routine w reflex microscopic  Result Value Ref Range   Specific Gravity, UA 1.010 1.005 - 1.030   pH, UA 6.0 5.0 - 7.5   Color, UA Yellow Yellow   Appearance Ur Clear Clear   Leukocytes,UA Negative Negative   Protein,UA Negative Negative/Trace   Glucose, UA Negative Negative   Ketones, UA Negative Negative   RBC, UA Negative Negative   Bilirubin, UA Negative Negative   Urobilinogen, Ur 0.2 0.2 - 1.0 mg/dL   Nitrite, UA Negative Negative  TSH  Result Value Ref Range   TSH 2.780 0.450 - 4.500 uIU/mL  LH  Result Value Ref Range   LH 35.0 mIU/mL  FSH  Result Value Ref Range    FSH 64.3 mIU/mL  Estradiol  Result Value Ref Range   Estradiol <5.0 pg/mL      Assessment & Plan:   Problem List Items Addressed This Visit       Respiratory   Allergic rhinitis    Chronic, ongoing -- frequent sinus issues reported and exam noting enlarged/red turbinates and congestion + mild cobblestone pattern throat.  Will add on Flonase to regimen, educated her on this and use.  Recommend trial off Claritin and change to other OTC antihistamine like Zyrtec or Allegra daily. Continue Benadryl as needed only.  May benefit from visit with ENT if ongoing issues.  Discussed with her.        Other   Sore throat - Primary    Improving, but ongoing sinus issues.  Suspect related to underlying allergic rhinitis + ongoing infection present.  Exam noting enlarged/red turbinates and congestion + mild cobblestone pattern throat.  Will add on Flonase to regimen, educated her on this and use.  Recommend trial off Claritin and change to other OTC antihistamine like Zyrtec or Allegra daily. Continue Benadryl as needed only.  May benefit from visit with ENT if ongoing issues.  Discussed with her.  Sent in Augmentin  BID for 7 days and Prednisone 40 MG daily for 5 days to assist with current infection.  Return in 8 weeks for follow-up on allergies.        Follow up plan: Return in about 8 weeks (around 03/16/2023) for Allergic rhinitis and sinus issues -- added on Flonase.

## 2023-01-19 NOTE — Patient Instructions (Signed)

## 2023-01-19 NOTE — Assessment & Plan Note (Signed)
Improving, but ongoing sinus issues.  Suspect related to underlying allergic rhinitis + ongoing infection present.  Exam noting enlarged/red turbinates and congestion + mild cobblestone pattern throat.  Will add on Flonase to regimen, educated her on this and use.  Recommend trial off Claritin and change to other OTC antihistamine like Zyrtec or Allegra daily. Continue Benadryl as needed only.  May benefit from visit with ENT if ongoing issues.  Discussed with her.  Sent in Augmentin BID for 7 days and Prednisone 40 MG daily for 5 days to assist with current infection.  Return in 8 weeks for follow-up on allergies.

## 2023-01-19 NOTE — Assessment & Plan Note (Signed)
Chronic, ongoing -- frequent sinus issues reported and exam noting enlarged/red turbinates and congestion + mild cobblestone pattern throat.  Will add on Flonase to regimen, educated her on this and use.  Recommend trial off Claritin and change to other OTC antihistamine like Zyrtec or Allegra daily. Continue Benadryl as needed only.  May benefit from visit with ENT if ongoing issues.  Discussed with her.

## 2023-03-04 ENCOUNTER — Ambulatory Visit: Payer: PRIVATE HEALTH INSURANCE | Admitting: Physician Assistant

## 2023-03-04 ENCOUNTER — Encounter: Payer: Self-pay | Admitting: Physician Assistant

## 2023-03-04 VITALS — BP 124/86 | HR 73 | Temp 100.0°F | Ht 66.0 in | Wt 210.2 lb

## 2023-03-04 DIAGNOSIS — M545 Low back pain, unspecified: Secondary | ICD-10-CM

## 2023-03-04 MED ORDER — DICLOFENAC SODIUM 75 MG PO TBEC
75.0000 mg | DELAYED_RELEASE_TABLET | Freq: Two times a day (BID) | ORAL | 0 refills | Status: DC
Start: 2023-03-04 — End: 2023-05-16

## 2023-03-04 MED ORDER — METHOCARBAMOL 750 MG PO TABS
750.0000 mg | ORAL_TABLET | Freq: Three times a day (TID) | ORAL | 0 refills | Status: DC | PRN
Start: 2023-03-04 — End: 2023-07-28

## 2023-03-04 NOTE — Patient Instructions (Signed)
Based on your symptoms and physical exam I believe the following is the cause of your concern today Back pain likely secondary to a strain of your back muscles  I recommend the following at this time to help relieve that discomfort:  Rest Warm compresses to the area (20 minutes on, minimum of 30 minutes off) You can alternate Tylenol and Ibuprofen for pain management but Ibuprofen is typically preferred to reduce inflammation.   I have sent in a script for Diclofenac for you to take by mouth twice per day. Do not use other NSAIDs while taking this as it could upset your stomach (this includes Meloxicam, Ibuprofen, Advil, Aleve, Excedrin)  Make sure you drink plenty of water and take this with food.   Gentle stretches and exercises that I have included in your paperwork Try to reduce excess strain to the area and rest as much as possible  Wear supportive shoes and, if you must lift anything, use proper lifting techniques that spare your back.   If these measures do not lead to improvement in your symptoms over the next 2-4 weeks please let us know

## 2023-03-04 NOTE — Progress Notes (Signed)
Acute Office Visit   Patient: Renee Parsons   DOB: 09-Jun-1971   52 y.o. Female  MRN: [id number] Visit Date: 03/04/2023  Today's healthcare provider: Oswaldo Conroy Paxtyn Boyar, PA-C  Introduced myself to the patient as a Secondary school teacher and provided education on APPs in clinical practice.    Chief Complaint  Patient presents with   Back Pain   Spasms    Patient says last week she was moving boxes and noticed her lower back to start having spasms. Patient says she has been trying muscle relaxer. Patient says she has been taking two tablets at night and a half during the day, if not she notices her back will tense up. Patient says her current regimen does help.    Subjective    HPI HPI     Spasms    Additional comments: Patient says last week she was moving boxes and noticed her lower back to start having spasms. Patient says she has been trying muscle relaxer. Patient says she has been taking two tablets at night and a half during the day, if not she notices her back will tense up. Patient says her current regimen does help.       Last edited by Malen Gauze, CMA on 03/04/2023  8:40 AM.       Onset: sudden, recurrent  She states she has flares every few months but this one seems a bit worse  Duration:  started last Thurs  Location: lower midline back pain  Radiation:none  Pain level and character: reports spasms in lower back 2/10 right now  Other associated symptoms: none  She denies numbness or tingling in legs, saddle anesthesia, fevers  Interventions: She has been taking Meloxicam, 30 mg at night and half tablet during the day  She has also been taking Ibuprofen and tylenol  She reports some mild stomach upset  Alleviating: meloxicam  Aggravating: the rain     Medications: Outpatient Medications Prior to Visit  Medication Sig   fluticasone (FLONASE) 50 MCG/ACT nasal spray Place 2 sprays into both nostrils daily.   loratadine (CLARITIN) 10 MG tablet Take 1 tablet (10 mg  total) by mouth daily.   montelukast (SINGULAIR) 10 MG tablet Take 1 tablet (10 mg total) by mouth at bedtime.   nystatin (MYCOSTATIN/NYSTOP) powder Apply 1 application. topically 3 (three) times daily.   [DISCONTINUED] meloxicam (MOBIC) 15 MG tablet Take 1 tablet (15 mg total) by mouth daily.   buPROPion (WELLBUTRIN SR) 150 MG 12 hr tablet 1 tab BID for 1 week, then 2 tabs in the AM and 1 tab in the PM for 1 week, then 2 tabs BID (Patient not taking: Reported on 03/04/2023)   Cholecalciferol (VITAMIN D3 PO) Take by mouth daily. (Patient not taking: Reported on 03/04/2023)   gabapentin (NEURONTIN) 100 MG capsule Take 1 capsule (100 mg total) by mouth 3 (three) times daily. (Patient not taking: Reported on 03/04/2023)   [DISCONTINUED] tiZANidine (ZANAFLEX) 4 MG tablet Take 1 tablet (4 mg total) by mouth every 6 (six) hours as needed for muscle spasms. (Patient not taking: Reported on 03/04/2023)   No facility-administered medications prior to visit.    Review of Systems  Musculoskeletal:  Positive for back pain. Negative for arthralgias, joint swelling, myalgias and neck stiffness.       Objective    BP 124/86   Pulse 73   Temp 100 F (37.8 C) (Oral)   Ht 5\' 6"  (1.676 m)  Wt 210 lb 3.2 oz (95.3 kg)   SpO2 98%   BMI 33.93 kg/m    Physical Exam Vitals reviewed.  Constitutional:      General: She is awake.     Appearance: Normal appearance. She is well-developed and well-groomed.  HENT:     Head: Normocephalic and atraumatic.  Pulmonary:     Effort: Pulmonary effort is normal.  Musculoskeletal:     Cervical back: Normal.     Thoracic back: Normal. Normal range of motion.     Lumbar back: Spasms present. Normal range of motion. Negative right straight leg raise test and negative left straight leg raise test.  Neurological:     Mental Status: She is alert.  Psychiatric:        Attention and Perception: Attention and perception normal.        Mood and Affect: Mood and affect  normal.        Speech: Speech normal.        Behavior: Behavior normal. Behavior is cooperative.       No results found for any visits on 03/04/23.  Assessment & Plan      No follow-ups on file.     Problem List Items Addressed This Visit   None Visit Diagnoses     Acute bilateral low back pain without sciatica    -  Primary Acute, recurrent concern Patient reports that she has flares of lower back pain every few months but especially when it is raining She reports persistent spasms across the midline lower back but reports that her range of motion is intact at this time I provided extensive education regarding meloxicam and additional NSAID use as she was taking too much meloxicam for management Will switch to diclofenac 75 mg p.o. twice daily and discussed that she should not use other NSAIDs while taking this to prevent GI upset and other side effects Will also provide prescription for Robaxin to assist with spasms-reviewed potential sedation and that she should not use when she needs to remain alert Patient can continue to use Tylenol as needed for pain management Reviewed using stretches, gentle massage, warm compresses to further assist with pain Follow-up as needed for progressing or persistent symptoms   Relevant Medications   diclofenac (VOLTAREN) 75 MG EC tablet   methocarbamol (ROBAXIN-750) 750 MG tablet        No follow-ups on file.   I, Ennis Delpozo E Shalin Vonbargen, PA-C, have reviewed all documentation for this visit. The documentation on 03/04/23 for the exam, diagnosis, procedures, and orders are all accurate and complete.   Jacquelin Hawking, MHS, PA-C [facility]

## 2023-03-25 ENCOUNTER — Ambulatory Visit: Payer: No Typology Code available for payment source | Admitting: Family Medicine

## 2023-05-16 ENCOUNTER — Ambulatory Visit (INDEPENDENT_AMBULATORY_CARE_PROVIDER_SITE_OTHER): Payer: PRIVATE HEALTH INSURANCE | Admitting: Family Medicine

## 2023-05-16 ENCOUNTER — Encounter: Payer: Self-pay | Admitting: Family Medicine

## 2023-05-16 VITALS — BP 132/84 | HR 94 | Ht 65.0 in | Wt 213.8 lb

## 2023-05-16 DIAGNOSIS — Z1231 Encounter for screening mammogram for malignant neoplasm of breast: Secondary | ICD-10-CM | POA: Diagnosis not present

## 2023-05-16 DIAGNOSIS — Z23 Encounter for immunization: Secondary | ICD-10-CM

## 2023-05-16 DIAGNOSIS — Z0000 Encounter for general adult medical examination without abnormal findings: Secondary | ICD-10-CM | POA: Diagnosis not present

## 2023-05-16 DIAGNOSIS — E6609 Other obesity due to excess calories: Secondary | ICD-10-CM | POA: Diagnosis not present

## 2023-05-16 DIAGNOSIS — Z6835 Body mass index (BMI) 35.0-35.9, adult: Secondary | ICD-10-CM

## 2023-05-16 NOTE — Progress Notes (Signed)
BP 132/84   Pulse 94   Ht 5\' 5"  (1.651 m)   Wt 213 lb 12.8 oz (97 kg)   SpO2 97%   BMI 35.58 kg/m    Subjective:    Patient ID: Renee Parsons, female    DOB: 11/03/1970, 52 y.o.   MRN: [id number]  HPI: Renee Parsons is a 52 y.o. female presenting on 05/16/2023 for comprehensive medical examination. Current medical complaints include:  WEIGHT GAIN Duration: chronic Previous attempts at weight loss: yes Complications of obesity: none Peak weight: current (213) Weight loss goal: to be healthy Weight loss to date: none Requesting obesity pharmacotherapy: no Current weight loss supplements/medications: no Previous weight loss supplements/meds: yes  Menopausal Symptoms: yes  Depression Screen done today and results listed below:     05/16/2023    3:39 PM 03/04/2023    8:42 AM 01/19/2023    9:30 AM 11/26/2021   10:08 AM 05/12/2021    3:57 PM  Depression screen PHQ 2/9  Decreased Interest 0 0 0 0 0  Down, Depressed, Hopeless 0 0 0 0 0  PHQ - 2 Score 0 0 0 0 0  Altered sleeping 0 2 0 0 1  Tired, decreased energy 1 0 1 1 0  Change in appetite 0 1 0 1 0  Feeling bad or failure about yourself  0 0 0 0 0  Trouble concentrating 0 0 0 3 0  Moving slowly or fidgety/restless 0 0 0 0 0  Suicidal thoughts 0 0 0 0 0  PHQ-9 Score 1 3 1 5 1   Difficult doing work/chores Somewhat difficult Not difficult at all Not difficult at all  Not difficult at all    Past Medical History:  Past Medical History:  Diagnosis Date   Chronic back pain     Surgical History:  Past Surgical History:  Procedure Laterality Date   COLONOSCOPY WITH PROPOFOL N/A 12/06/2019   Procedure: COLONOSCOPY WITH BIOPSY;  Surgeon: Toney Reil, MD;  Location: [facility];  Service: Endoscopy;  Laterality: N/A;  priority 4   POLYPECTOMY N/A 12/06/2019   Procedure: POLYPECTOMY;  Surgeon: Toney Reil, MD;  Location: [facility];  Service: Endoscopy;  Laterality: N/A;   TOTAL ABDOMINAL  HYSTERECTOMY W/ BILATERAL SALPINGOOPHORECTOMY      Medications:  Current Outpatient Medications on File Prior to Visit  Medication Sig   loratadine (CLARITIN) 10 MG tablet Take 1 tablet (10 mg total) by mouth daily.   methocarbamol (ROBAXIN-750) 750 MG tablet Take 1 tablet (750 mg total) by mouth every 8 (eight) hours as needed for muscle spasms.   montelukast (SINGULAIR) 10 MG tablet Take 1 tablet (10 mg total) by mouth at bedtime.   No current facility-administered medications on file prior to visit.    Allergies:  No Known Allergies  Social History:  Social History   Socioeconomic History   Marital status: Married    Spouse name: Not on file   Number of children: Not on file   Years of education: Not on file   Highest education level: Not on file  Occupational History   Not on file  Tobacco Use   Smoking status: Former    Current packs/day: 0.25    Types: Cigarettes   Smokeless tobacco: Never  Vaping Use   Vaping status: Never Used  Substance and Sexual Activity   Alcohol use: Yes    Alcohol/week: 4.0 standard drinks of alcohol    Types: 4 Glasses of wine  per week   Drug use: No   Sexual activity: Not Currently    Birth control/protection: Surgical  Other Topics Concern   Not on file  Social History Narrative   Not on file   Social Determinants of Health   Financial Resource Strain: Not on file  Food Insecurity: Not on file  Transportation Needs: Not on file  Physical Activity: Not on file  Stress: Not on file  Social Connections: Not on file  Intimate Partner Violence: Not on file   Social History   Tobacco Use  Smoking Status Former   Current packs/day: 0.25   Types: Cigarettes  Smokeless Tobacco Never   Social History   Substance and Sexual Activity  Alcohol Use Yes   Alcohol/week: 4.0 standard drinks of alcohol   Types: 4 Glasses of wine per week    Family History:  Family History  Problem Relation Age of Onset   Migraines Mother     Cancer Father    Diabetes Maternal Grandmother     Past medical history, surgical history, medications, allergies, family history and social history reviewed with patient today and changes made to appropriate areas of the chart.   Review of Systems  Constitutional:  Positive for diaphoresis and malaise/fatigue. Negative for chills, fever and weight loss.  HENT: Negative.    Eyes: Negative.   Cardiovascular: Negative.   Gastrointestinal: Negative.   Genitourinary: Negative.   Musculoskeletal: Negative.  Myalgias: screenin.  Skin: Negative.   Neurological: Negative.   Endo/Heme/Allergies:  Positive for environmental allergies. Negative for polydipsia. Bruises/bleeds easily.  Psychiatric/Behavioral:  Negative for depression, hallucinations, memory loss, substance abuse and suicidal ideas. The patient has insomnia. The patient is not nervous/anxious.    All other ROS negative except what is listed above and in the HPI.      Objective:    BP 132/84   Pulse 94   Ht 5\' 5"  (1.651 m)   Wt 213 lb 12.8 oz (97 kg)   SpO2 97%   BMI 35.58 kg/m   Wt Readings from Last 3 Encounters:  05/16/23 213 lb 12.8 oz (97 kg)  03/04/23 210 lb 3.2 oz (95.3 kg)  01/19/23 208 lb 3.2 oz (94.4 kg)    Physical Exam Vitals and nursing note reviewed.  Constitutional:      General: She is not in acute distress.    Appearance: Normal appearance. She is not ill-appearing, toxic-appearing or diaphoretic.  HENT:     Head: Normocephalic and atraumatic.     Right Ear: Tympanic membrane, ear canal and external ear normal. There is no impacted cerumen.     Left Ear: Tympanic membrane, ear canal and external ear normal. There is no impacted cerumen.     Nose: Nose normal. No congestion or rhinorrhea.     Mouth/Throat:     Mouth: Mucous membranes are moist.     Pharynx: Oropharynx is clear. No oropharyngeal exudate or posterior oropharyngeal erythema.  Eyes:     General: No scleral icterus.       Right eye: No  discharge.        Left eye: No discharge.     Extraocular Movements: Extraocular movements intact.     Conjunctiva/sclera: Conjunctivae normal.     Pupils: Pupils are equal, round, and reactive to light.  Neck:     Vascular: No carotid bruit.  Cardiovascular:     Rate and Rhythm: Normal rate and regular rhythm.     Pulses: Normal pulses.  Heart sounds: No murmur heard.    No friction rub. No gallop.  Pulmonary:     Effort: Pulmonary effort is normal. No respiratory distress.     Breath sounds: Normal breath sounds. No stridor. No wheezing, rhonchi or rales.  Chest:     Chest wall: No tenderness.  Abdominal:     General: Abdomen is flat. Bowel sounds are normal. There is no distension.     Palpations: Abdomen is soft. There is no mass.     Tenderness: There is no abdominal tenderness. There is no right CVA tenderness, left CVA tenderness, guarding or rebound.     Hernia: No hernia is present.  Genitourinary:    Comments: Breast and pelvic exams deferred with shared decision making Musculoskeletal:        General: No swelling, tenderness, deformity or signs of injury.     Cervical back: Normal range of motion and neck supple. No rigidity. No muscular tenderness.     Right lower leg: No edema.     Left lower leg: No edema.  Lymphadenopathy:     Cervical: No cervical adenopathy.  Skin:    General: Skin is warm and dry.     Capillary Refill: Capillary refill takes less than 2 seconds.     Coloration: Skin is not jaundiced or pale.     Findings: No bruising, erythema, lesion or rash.  Neurological:     General: No focal deficit present.     Mental Status: She is alert and oriented to person, place, and time. Mental status is at baseline.     Cranial Nerves: No cranial nerve deficit.     Sensory: No sensory deficit.     Motor: No weakness.     Coordination: Coordination normal.     Gait: Gait normal.     Deep Tendon Reflexes: Reflexes normal.  Psychiatric:        Mood and  Affect: Mood normal.        Behavior: Behavior normal.        Thought Content: Thought content normal.        Judgment: Judgment normal.     Results for orders placed or performed in visit on 12/02/20  CBC with Differential/Platelet  Result Value Ref Range   WBC 6.4 3.4 - 10.8 x10E3/uL   RBC 4.93 3.77 - 5.28 x10E6/uL   Hemoglobin 14.0 11.1 - 15.9 g/dL   Hematocrit 47.8 29.5 - 46.6 %   MCV 86 79 - 97 fL   MCH 28.4 26.6 - 33.0 pg   MCHC 33.2 31.5 - 35.7 g/dL   RDW 62.1 30.8 - 65.7 %   Platelets 398 150 - 450 x10E3/uL   Neutrophils 55 Not Estab. %   Lymphs 34 Not Estab. %   Monocytes 7 Not Estab. %   Eos 3 Not Estab. %   Basos 1 Not Estab. %   Neutrophils Absolute 3.5 1.4 - 7.0 x10E3/uL   Lymphocytes Absolute 2.2 0.7 - 3.1 x10E3/uL   Monocytes Absolute 0.4 0.1 - 0.9 x10E3/uL   EOS (ABSOLUTE) 0.2 0.0 - 0.4 x10E3/uL   Basophils Absolute 0.1 0.0 - 0.2 x10E3/uL   Immature Granulocytes 0 Not Estab. %   Immature Grans (Abs) 0.0 0.0 - 0.1 x10E3/uL  Comprehensive metabolic panel  Result Value Ref Range   Glucose 88 65 - 99 mg/dL   BUN 9 6 - 24 mg/dL   Creatinine, Ser 8.46 0.57 - 1.00 mg/dL   eGFR 81 >96 EX/BMW/4.13  BUN/Creatinine Ratio 10 9 - 23   Sodium 139 134 - 144 mmol/L   Potassium 4.4 3.5 - 5.2 mmol/L   Chloride 103 96 - 106 mmol/L   CO2 19 (L) 20 - 29 mmol/L   Calcium 9.8 8.7 - 10.2 mg/dL   Total Protein 8.3 6.0 - 8.5 g/dL   Albumin 4.6 3.8 - 4.9 g/dL   Globulin, Total 3.7 1.5 - 4.5 g/dL   Albumin/Globulin Ratio 1.2 1.2 - 2.2   Bilirubin Total 0.3 0.0 - 1.2 mg/dL   Alkaline Phosphatase 151 (H) 44 - 121 IU/L   AST 29 0 - 40 IU/L   ALT 26 0 - 32 IU/L  Lipid Panel w/o Chol/HDL Ratio  Result Value Ref Range   Cholesterol, Total 238 (H) 100 - 199 mg/dL   Triglycerides 578 0 - 149 mg/dL   HDL 61 >46 mg/dL   VLDL Cholesterol Cal 21 5 - 40 mg/dL   LDL Chol Calc (NIH) 962 (H) 0 - 99 mg/dL  Urinalysis, Routine w reflex microscopic  Result Value Ref Range   Specific  Gravity, UA 1.010 1.005 - 1.030   pH, UA 6.0 5.0 - 7.5   Color, UA Yellow Yellow   Appearance Ur Clear Clear   Leukocytes,UA Negative Negative   Protein,UA Negative Negative/Trace   Glucose, UA Negative Negative   Ketones, UA Negative Negative   RBC, UA Negative Negative   Bilirubin, UA Negative Negative   Urobilinogen, Ur 0.2 0.2 - 1.0 mg/dL   Nitrite, UA Negative Negative  TSH  Result Value Ref Range   TSH 2.780 0.450 - 4.500 uIU/mL  LH  Result Value Ref Range   LH 35.0 mIU/mL  FSH  Result Value Ref Range   FSH 64.3 mIU/mL  Estradiol  Result Value Ref Range   Estradiol <5.0 pg/mL      Assessment & Plan:   Problem List Items Addressed This Visit       Other   Class 2 obesity due to excess calories without serious comorbidity with body mass index (BMI) of 35.0 to 35.9 in adult    Encouraged diet and exercise with goal of losing 1-2lbs per week. Call with any concerns.       Other Visit Diagnoses     Routine general medical examination at a health care facility    -  Primary   Vaccines up to date/declined. Screening labs checked today. Pap N/A. Mammo ordered. Colonoscopy up to date. Continue diet and exercise. Call with any concerns.   Relevant Orders   CBC with Differential/Platelet   Comprehensive metabolic panel   Lipid Panel w/o Chol/HDL Ratio   TSH   Encounter for screening mammogram for malignant neoplasm of breast       Mammo ordered today.   Relevant Orders   MM 3D SCREENING MAMMOGRAM BILATERAL BREAST   Need for shingles vaccine       Shingles shot given today.        Follow up plan: Return in about 1 year (around 05/15/2024).   LABORATORY TESTING:  - Pap smear: not applicable  IMMUNIZATIONS:   - Tdap: Tetanus vaccination status reviewed: last tetanus booster within 10 years. - Influenza: Refused - Pneumovax: Not applicable - Prevnar: Not applicable - COVID: Refused - HPV: Not applicable - Shingrix vaccine: Administered  today  SCREENING: -Mammogram: Ordered today  - Colonoscopy: Up to date    PATIENT COUNSELING:   Advised to take 1 mg of folate supplement per day if  capable of pregnancy.   Sexuality: Discussed sexually transmitted diseases, partner selection, use of condoms, avoidance of unintended pregnancy  and contraceptive alternatives.   Advised to avoid cigarette smoking.  I discussed with the patient that most people either abstain from alcohol or drink within safe limits (<=14/week and <=4 drinks/occasion for males, <=7/weeks and <= 3 drinks/occasion for females) and that the risk for alcohol disorders and other health effects rises proportionally with the number of drinks per week and how often a drinker exceeds daily limits.  Discussed cessation/primary prevention of drug use and availability of treatment for abuse.   Diet: Encouraged to adjust caloric intake to maintain  or achieve ideal body weight, to reduce intake of dietary saturated fat and total fat, to limit sodium intake by avoiding high sodium foods and not adding table salt, and to maintain adequate dietary potassium and calcium preferably from fresh fruits, vegetables, and low-fat dairy products.    stressed the importance of regular exercise  Injury prevention: Discussed safety belts, safety helmets, smoke detector, smoking near bedding or upholstery.   Dental health: Discussed importance of regular tooth brushing, flossing, and dental visits.    NEXT PREVENTATIVE PHYSICAL DUE IN 1 YEAR. Return in about 1 year (around 05/15/2024).

## 2023-05-16 NOTE — Assessment & Plan Note (Signed)
Encouraged diet and exercise with goal of losing 1-2lbs per week. Call with any concerns.

## 2023-05-17 LAB — COMPREHENSIVE METABOLIC PANEL
ALT: 35 IU/L — ABNORMAL HIGH (ref 0–32)
AST: 33 IU/L (ref 0–40)
Albumin: 4.2 g/dL (ref 3.8–4.9)
Alkaline Phosphatase: 166 IU/L — ABNORMAL HIGH (ref 44–121)
BUN/Creatinine Ratio: 13 (ref 9–23)
BUN: 11 mg/dL (ref 6–24)
Bilirubin Total: <0.2 mg/dL (ref 0.0–1.2)
CO2: 20 mmol/L (ref 20–29)
Calcium: 9.2 mg/dL (ref 8.7–10.2)
Chloride: 107 mmol/L — ABNORMAL HIGH (ref 96–106)
Creatinine, Ser: 0.82 mg/dL (ref 0.57–1.00)
Globulin, Total: 3.2 g/dL (ref 1.5–4.5)
Glucose: 108 mg/dL — ABNORMAL HIGH (ref 70–99)
Potassium: 3.9 mmol/L (ref 3.5–5.2)
Sodium: 142 mmol/L (ref 134–144)
Total Protein: 7.4 g/dL (ref 6.0–8.5)
eGFR: 85 mL/min/{1.73_m2} (ref >59)

## 2023-05-17 LAB — CBC WITH DIFFERENTIAL/PLATELET
Basophils Absolute: 0.1 10*3/uL (ref 0.0–0.2)
Basos: 1 %
EOS (ABSOLUTE): 0.2 10*3/uL (ref 0.0–0.4)
Eos: 2 %
Hematocrit: 37.9 % (ref 34.0–46.6)
Hemoglobin: 12.2 g/dL (ref 11.1–15.9)
Immature Grans (Abs): 0 10*3/uL (ref 0.0–0.1)
Immature Granulocytes: 0 %
Lymphocytes Absolute: 2.7 10*3/uL (ref 0.7–3.1)
Lymphs: 30 %
MCH: 28 pg (ref 26.6–33.0)
MCHC: 32.2 g/dL (ref 31.5–35.7)
MCV: 87 fL (ref 79–97)
Monocytes Absolute: 0.6 10*3/uL (ref 0.1–0.9)
Monocytes: 7 %
Neutrophils Absolute: 5.5 10*3/uL (ref 1.4–7.0)
Neutrophils: 60 %
Platelets: 400 10*3/uL (ref 150–450)
RBC: 4.36 x10E6/uL (ref 3.77–5.28)
RDW: 12.9 % (ref 11.7–15.4)
WBC: 9.1 10*3/uL (ref 3.4–10.8)

## 2023-05-17 LAB — LIPID PANEL W/O CHOL/HDL RATIO
Cholesterol, Total: 224 mg/dL — ABNORMAL HIGH (ref 100–199)
HDL: 58 mg/dL (ref >39)
LDL Chol Calc (NIH): 130 mg/dL — ABNORMAL HIGH (ref 0–99)
Triglycerides: 206 mg/dL — ABNORMAL HIGH (ref 0–149)
VLDL Cholesterol Cal: 36 mg/dL (ref 5–40)

## 2023-05-17 LAB — TSH: TSH: 2.14 u[IU]/mL (ref 0.450–4.500)

## 2023-05-27 ENCOUNTER — Ambulatory Visit
Admission: RE | Admit: 2023-05-27 | Discharge: 2023-05-27 | Disposition: A | Discharge status: Home / Self Care | Payer: PRIVATE HEALTH INSURANCE | Source: Ambulatory Visit | Attending: Family Medicine | Admitting: Family Medicine

## 2023-05-27 DIAGNOSIS — Z1231 Encounter for screening mammogram for malignant neoplasm of breast: Secondary | ICD-10-CM | POA: Insufficient documentation

## 2023-05-31 ENCOUNTER — Other Ambulatory Visit: Payer: Self-pay | Admitting: Family Medicine

## 2023-05-31 DIAGNOSIS — R928 Other abnormal and inconclusive findings on diagnostic imaging of breast: Secondary | ICD-10-CM

## 2023-06-15 ENCOUNTER — Ambulatory Visit
Admission: RE | Admit: 2023-06-15 | Discharge: 2023-06-15 | Disposition: A | Discharge status: Home / Self Care | Payer: PRIVATE HEALTH INSURANCE | Source: Ambulatory Visit | Attending: Family Medicine | Admitting: Family Medicine

## 2023-06-15 DIAGNOSIS — R928 Other abnormal and inconclusive findings on diagnostic imaging of breast: Secondary | ICD-10-CM

## 2023-07-28 ENCOUNTER — Other Ambulatory Visit: Payer: Self-pay | Admitting: Physician Assistant

## 2023-07-28 DIAGNOSIS — M545 Low back pain, unspecified: Secondary | ICD-10-CM

## 2023-07-28 MED ORDER — METHOCARBAMOL 750 MG PO TABS: 750.0000 mg | ORAL_TABLET | Freq: Three times a day (TID) | ORAL | 0 refills | Status: DC | PRN

## 2023-10-25 ENCOUNTER — Ambulatory Visit: Payer: Self-pay | Admitting: Family Medicine

## 2023-10-25 NOTE — Telephone Encounter (Signed)
 Chief Complaint: Congestion Symptoms: Yellow/clear mucus with blood in it Frequency: Intermittent Pertinent Negatives: Patient denies fever, sore throat, cough, earache, difficulty breathing Disposition: [] ED /[] Urgent Care (no appt availability in office) / [x] Appointment(In office/virtual)/ []  [facility]/ [] Home Care/ [] Refused Recommended Disposition /[] [place] [facility]/ []  Follow-up with PCP Additional Notes: Pt states her sinuses are bleeding when she blows her nose. Pt states her mucus is yellow/clear with red blood in it. Pt states this has been happening often over the past year. Pt scheduled for an appt with PCP on 3/6. This RN educated pt on home care, new-worsening symptoms, when to call back/seek emergent care. Pt verbalized understanding and agrees to plan.    Copied from CRM [id number]. Topic: Clinical - Red Word Triage >> Oct 25, 2023 10:02 AM Hamdi H wrote: [facility] that prompted transfer to Nurse Triage: Nose bleeding getting worse Reason for Disposition  [1] Sinus congestion (pressure, fullness) AND [2] present > 10 days  Answer Assessment - Initial Assessment Questions 1. LOCATION: "Where does it hurt?"      Denies pain 2. ONSET: "When did the sinus pain start?"  (e.g., hours, days)      Denies pain 3. RECURRENT SYMPTOM: "Have you ever had sinus problems before?" If Yes, ask: "When was the last time?" and "What happened that time?"      It happens quite a bit within last year, happens when weather changes 4. NASAL DISCHARGE: "Do you have discharge from your nose?" If so ask, "What color?"     Yellow/clear with blood in it, not a whole lot, concerning because it didn't used to do that 7. FEVER: "Do you have a fever?" If Yes, ask: "What is it, how was it measured, and when did it start?"      Denies 8. OTHER SYMPTOMS: "Do you have any other symptoms?" (e.g., sore throat, cough, earache, difficulty breathing)     Denies  Protocols used: Sinus Pain or  Congestion-A-AH

## 2023-10-27 ENCOUNTER — Ambulatory Visit: Payer: Self-pay | Admitting: Family Medicine

## 2023-10-27 ENCOUNTER — Encounter: Payer: Self-pay | Admitting: Family Medicine

## 2023-10-27 VITALS — BP 128/87 | HR 94 | Temp 98.4°F | Resp 17 | Wt 214.6 lb

## 2023-10-27 DIAGNOSIS — J0101 Acute recurrent maxillary sinusitis: Secondary | ICD-10-CM | POA: Diagnosis not present

## 2023-10-27 DIAGNOSIS — R0981 Nasal congestion: Secondary | ICD-10-CM | POA: Diagnosis not present

## 2023-10-27 MED ORDER — AMOXICILLIN-POT CLAVULANATE 875-125 MG PO TABS: 1.0000 | ORAL_TABLET | Freq: Two times a day (BID) | ORAL | 0 refills | Status: DC

## 2023-10-27 NOTE — Progress Notes (Signed)
 BP 128/87 (BP Location: Left Arm, Patient Position: Sitting, Cuff Size: Large)   Pulse 94   Temp 98.4 F (36.9 C) (Oral)   Resp 17   Wt 214 lb 9.6 oz (97.3 kg)   SpO2 98%   BMI 35.71 kg/m    Subjective:    Patient ID: Skip Estimable, female    DOB: [birth year], 53 y.o.   MRN: [id number]  HPI: BRITANEE VANBLARCOM is a 52 y.o. female  Chief Complaint  Patient presents with   Sinus Problem    Should of been back sometime last year but didn't. Is having more than 4 times a year infections. When blowing has blood x 6 months.    UPPER RESPIRATORY TRACT INFECTION Duration: chronic- no changes at all in 6 months Worst symptom: congestion and a little bit of blood Fever: no Cough: no Shortness of breath: no Wheezing: no Chest pain: no Chest tightness: no Chest congestion: no Nasal congestion: yes Runny nose: no Post nasal drip: yes Sneezing: yes Sore throat: no Swollen glands: no Sinus pressure: yes Headache: no Face pain: no Toothache: no Ear pain: no  Ear pressure: no  Eyes red/itching:yes Eye drainage/crusting: no  Vomiting: no Rash: no Fatigue: no Sick contacts: no Strep contacts: no  Context: stable Recurrent sinusitis: no Relief with OTC cold/cough medications: no  Treatments attempted: claritin, singulair, flonase,  benadryl  Relevant past medical, surgical, family and social history reviewed and updated as indicated. Interim medical history since our last visit reviewed. Allergies and medications reviewed and updated.  Review of Systems  Constitutional: Negative.   HENT:  Positive for congestion, nosebleeds, postnasal drip, sinus pressure and sneezing. Negative for dental problem, drooling, ear discharge, ear pain, facial swelling, hearing loss, mouth sores, rhinorrhea, sinus pain, sore throat, tinnitus, trouble swallowing and voice change.   Eyes: Negative.   Respiratory: Negative.    Cardiovascular: Negative.   Gastrointestinal: Negative.    Psychiatric/Behavioral: Negative.      Per HPI unless specifically indicated above     Objective:    BP 128/87 (BP Location: Left Arm, Patient Position: Sitting, Cuff Size: Large)   Pulse 94   Temp 98.4 F (36.9 C) (Oral)   Resp 17   Wt 214 lb 9.6 oz (97.3 kg)   SpO2 98%   BMI 35.71 kg/m   Wt Readings from Last 3 Encounters:  10/27/23 214 lb 9.6 oz (97.3 kg)  05/16/23 213 lb 12.8 oz (97 kg)  03/04/23 210 lb 3.2 oz (95.3 kg)    Physical Exam Vitals and nursing note reviewed.  Constitutional:      General: She is not in acute distress.    Appearance: Normal appearance. She is not ill-appearing, toxic-appearing or diaphoretic.  HENT:     Head: Normocephalic and atraumatic.     Right Ear: Tympanic membrane, ear canal and external ear normal.     Left Ear: Tympanic membrane, ear canal and external ear normal.     Nose: Congestion and rhinorrhea present.     Mouth/Throat:     Mouth: Mucous membranes are moist.     Pharynx: Oropharynx is clear. No oropharyngeal exudate or posterior oropharyngeal erythema.  Eyes:     General: No scleral icterus.       Right eye: No discharge.        Left eye: No discharge.     Extraocular Movements: Extraocular movements intact.     Conjunctiva/sclera: Conjunctivae normal.     Pupils: Pupils are equal,  round, and reactive to light.  Cardiovascular:     Rate and Rhythm: Normal rate and regular rhythm.     Pulses: Normal pulses.     Heart sounds: Normal heart sounds. No murmur heard.    No friction rub. No gallop.  Pulmonary:     Effort: Pulmonary effort is normal. No respiratory distress.     Breath sounds: Normal breath sounds. No stridor. No wheezing, rhonchi or rales.  Chest:     Chest wall: No tenderness.  Musculoskeletal:        General: Normal range of motion.     Cervical back: Normal range of motion and neck supple.  Skin:    General: Skin is warm and dry.     Capillary Refill: Capillary refill takes less than 2 seconds.      Coloration: Skin is not jaundiced or pale.     Findings: No bruising, erythema, lesion or rash.  Neurological:     General: No focal deficit present.     Mental Status: She is alert and oriented to person, place, and time. Mental status is at baseline.  Psychiatric:        Mood and Affect: Mood normal.        Behavior: Behavior normal.        Thought Content: Thought content normal.        Judgment: Judgment normal.     Results for orders placed or performed in visit on 05/16/23  CBC with Differential/Platelet   Collection Time: 05/16/23  3:41 PM  Result Value Ref Range   WBC 9.1 3.4 - 10.8 x10E3/uL   RBC 4.36 3.77 - 5.28 x10E6/uL   Hemoglobin 12.2 11.1 - 15.9 g/dL   Hematocrit 96.2 95.2 - 46.6 %   MCV 87 79 - 97 fL   MCH 28.0 26.6 - 33.0 pg   MCHC 32.2 31.5 - 35.7 g/dL   RDW 84.1 32.4 - 40.1 %   Platelets 400 150 - 450 x10E3/uL   Neutrophils 60 Not Estab. %   Lymphs 30 Not Estab. %   Monocytes 7 Not Estab. %   Eos 2 Not Estab. %   Basos 1 Not Estab. %   Neutrophils Absolute 5.5 1.4 - 7.0 x10E3/uL   Lymphocytes Absolute 2.7 0.7 - 3.1 x10E3/uL   Monocytes Absolute 0.6 0.1 - 0.9 x10E3/uL   EOS (ABSOLUTE) 0.2 0.0 - 0.4 x10E3/uL   Basophils Absolute 0.1 0.0 - 0.2 x10E3/uL   Immature Granulocytes 0 Not Estab. %   Immature Grans (Abs) 0.0 0.0 - 0.1 x10E3/uL  Comprehensive metabolic panel   Collection Time: 05/16/23  3:41 PM  Result Value Ref Range   Glucose 108 (H) 70 - 99 mg/dL   BUN 11 6 - 24 mg/dL   Creatinine, Ser 0.27 0.57 - 1.00 mg/dL   eGFR 85 >25 DG/UYQ/0.34   BUN/Creatinine Ratio 13 9 - 23   Sodium 142 134 - 144 mmol/L   Potassium 3.9 3.5 - 5.2 mmol/L   Chloride 107 (H) 96 - 106 mmol/L   CO2 20 20 - 29 mmol/L   Calcium 9.2 8.7 - 10.2 mg/dL   Total Protein 7.4 6.0 - 8.5 g/dL   Albumin 4.2 3.8 - 4.9 g/dL   Globulin, Total 3.2 1.5 - 4.5 g/dL   Bilirubin Total <7.4 0.0 - 1.2 mg/dL   Alkaline Phosphatase 166 (H) 44 - 121 IU/L   AST 33 0 - 40 IU/L   ALT 35 (H) 0  - 32 IU/L  Lipid Panel w/o Chol/HDL Ratio   Collection Time: 05/16/23  3:41 PM  Result Value Ref Range   Cholesterol, Total 224 (H) 100 - 199 mg/dL   Triglycerides 295 (H) 0 - 149 mg/dL   HDL 58 >62 mg/dL   VLDL Cholesterol Cal 36 5 - 40 mg/dL   LDL Chol Calc (NIH) 130 (H) 0 - 99 mg/dL  TSH   Collection Time: 05/16/23  3:41 PM  Result Value Ref Range   TSH 2.140 0.450 - 4.500 uIU/mL      Assessment & Plan:   Problem List Items Addressed This Visit   None Visit Diagnoses       Acute recurrent maxillary sinusitis    -  Primary   Will treat with augmentin. Call if not getting better or getting worse.   Relevant Medications   amoxicillin-clavulanate (AUGMENTIN) 875-125 MG tablet   Other Relevant Orders   Ambulatory referral to ENT     Chronic nasal congestion       Chronic for over a year. Would like to see ENT. Referral generated today. Call with any concerns.   Relevant Orders   Ambulatory referral to ENT        Follow up plan: Return for As scheduled.

## 2023-12-27 ENCOUNTER — Other Ambulatory Visit: Payer: Self-pay

## 2023-12-27 ENCOUNTER — Telehealth: Payer: Self-pay | Admitting: Family Medicine

## 2023-12-27 NOTE — Telephone Encounter (Signed)
  Medication not listed - sending as patient call. Thank You    Copied from CRM [id number]. Topic: Clinical - Medication Refill >> Dec 27, 2023  3:55 PM Crispin Dolphin wrote: Most Recent Primary Care Visit:  Provider: Terre Ferri P  Department: [facility]  Visit Type: ACUTE  Date: 10/27/2023  Medication: diclofenac  (VOLTAREN ) 75 MG EC tablet  Has the patient contacted their pharmacy? No - says no refills  (Agent: If no, request that the patient contact the pharmacy for the refill. If patient does not wish to contact the pharmacy document the reason why and proceed with request.) (Agent: If yes, when and what did the pharmacy advise?)  Is this the correct pharmacy for this prescription? Yes If no, delete pharmacy and type the correct one.  This is the patient's preferred pharmacy:  [facility] [address]),  - [address] [place]) [place] [address] Phone: [phone] Fax: [phone]   Has the prescription been filled recently? No  Is the patient out of the medication? Yes  Has the patient been seen for an appointment in the last year OR does the patient have an upcoming appointment? Yes  Can we respond through MyChart? Yes  Agent: Please be advised that Rx refills may take up to 3 business days. We ask that you follow-up with your pharmacy.

## 2023-12-27 NOTE — Telephone Encounter (Signed)
 RN contact pt as pt is requesting diclofenac  (VOLTAREN ) 75 MG EC table. Pt states that she has taken this in the past for her back pain. Pt states this is a known issue and has seen the PCP for this.

## 2023-12-28 MED ORDER — DICLOFENAC SODIUM 75 MG PO TBEC: 75.0000 mg | DELAYED_RELEASE_TABLET | Freq: Two times a day (BID) | ORAL | 0 refills | Status: DC

## 2023-12-28 NOTE — Telephone Encounter (Signed)
 Copied from CRM [id number]. Topic: Clinical - Medication Refill >> Dec 27, 2023  3:55 PM Crispin Dolphin wrote: Most Recent Primary Care Visit:  Provider: Terre Ferri P  Department: [facility]  Visit Type: ACUTE  Date: 10/27/2023  Medication: diclofenac  (VOLTAREN ) 75 MG EC tablet  Has the patient contacted their pharmacy? No - says no refills  (Agent: If no, request that the patient contact the pharmacy for the refill. If patient does not wish to contact the pharmacy document the reason why and proceed with request.) (Agent: If yes, when and what did the pharmacy advise?)  Is this the correct pharmacy for this prescription? Yes If no, delete pharmacy and type the correct one.  This is the patient's preferred pharmacy:  [facility] [address]), [place] - [address] [place]ough) [place] [address] Phone: [phone] Fax: [phone]   Has the prescription been filled recently? No  Is the patient out of the medication? Yes  Has the patient been seen for an appointment in the last year OR does the patient have an upcoming appointment? Yes  Can we respond through MyChart? Yes  Agent: Please be advised that Rx refills may take up to 3 business days. We ask that you follow-up with your pharmacy. >> Dec 28, 2023  4:34 PM Bridgette Campus T wrote: diclofenac  (VOLTAREN ) 75 MG EC tablet - Patient wants update on this medication refill- [phone]

## 2023-12-30 ENCOUNTER — Other Ambulatory Visit: Payer: Self-pay | Admitting: Nurse Practitioner

## 2023-12-30 ENCOUNTER — Other Ambulatory Visit: Payer: Self-pay | Admitting: Family Medicine

## 2023-12-30 DIAGNOSIS — M545 Low back pain, unspecified: Secondary | ICD-10-CM

## 2023-12-31 MED ORDER — DICLOFENAC SODIUM 75 MG PO TBEC: 75.0000 mg | DELAYED_RELEASE_TABLET | Freq: Two times a day (BID) | ORAL | 0 refills | Status: DC

## 2023-12-31 MED ORDER — METHOCARBAMOL 750 MG PO TABS
750.0000 mg | ORAL_TABLET | Freq: Three times a day (TID) | ORAL | 0 refills | Status: DC | PRN
Start: 2023-12-31 — End: 2024-05-16

## 2024-01-02 ENCOUNTER — Other Ambulatory Visit: Payer: Self-pay | Admitting: Nurse Practitioner

## 2024-01-03 ENCOUNTER — Other Ambulatory Visit: Payer: Self-pay

## 2024-01-03 MED ORDER — DICLOFENAC SODIUM 75 MG PO TBEC
75.0000 mg | DELAYED_RELEASE_TABLET | Freq: Two times a day (BID) | ORAL | 0 refills | Status: DC
  Filled 2024-01-03: qty 30, 15d supply, fill #0

## 2024-05-16 ENCOUNTER — Ambulatory Visit (INDEPENDENT_AMBULATORY_CARE_PROVIDER_SITE_OTHER): Payer: Self-pay | Admitting: Family Medicine

## 2024-05-16 ENCOUNTER — Encounter: Payer: Self-pay | Admitting: Family Medicine

## 2024-05-16 VITALS — BP 126/91 | HR 97 | Temp 98.0°F | Ht 65.0 in | Wt 217.6 lb

## 2024-05-16 DIAGNOSIS — Z1231 Encounter for screening mammogram for malignant neoplasm of breast: Secondary | ICD-10-CM | POA: Diagnosis not present

## 2024-05-16 DIAGNOSIS — M545 Low back pain, unspecified: Secondary | ICD-10-CM

## 2024-05-16 DIAGNOSIS — Z0000 Encounter for general adult medical examination without abnormal findings: Secondary | ICD-10-CM

## 2024-05-16 DIAGNOSIS — J3089 Other allergic rhinitis: Secondary | ICD-10-CM

## 2024-05-16 LAB — BAYER DCA HB A1C WAIVED: HB A1C (BAYER DCA - WAIVED): 5.6 % (ref 4.8–5.6)

## 2024-05-16 MED ORDER — LORATADINE 10 MG PO TABS: 10.0000 mg | ORAL_TABLET | Freq: Every day | ORAL | 3 refills | Status: AC

## 2024-05-16 MED ORDER — MONTELUKAST SODIUM 10 MG PO TABS: 10.0000 mg | ORAL_TABLET | Freq: Every day | ORAL | 3 refills | Status: AC

## 2024-05-16 MED ORDER — DICLOFENAC SODIUM 75 MG PO TBEC: 75.0000 mg | DELAYED_RELEASE_TABLET | Freq: Two times a day (BID) | ORAL | 0 refills | Status: AC

## 2024-05-16 MED ORDER — METHOCARBAMOL 750 MG PO TABS: 750.0000 mg | ORAL_TABLET | Freq: Three times a day (TID) | ORAL | 0 refills | Status: AC | PRN

## 2024-05-16 NOTE — Patient Instructions (Signed)
 Please call to schedule your mammogram and/or bone density: [facility] at [facility]  Address: [address], [place], [place] [address] Phone: [phone]  [facility] [address] [place],  [place]  [address] Phone: [phone]

## 2024-05-16 NOTE — Progress Notes (Signed)
 BP (!) 126/91 (BP Location: Left Arm, Patient Position: Sitting, Cuff Size: Normal)   Pulse 97   Temp 98 F (36.7 C) (Oral)   Ht 5' 5 (1.651 m)   Wt 217 lb 9.6 oz (98.7 kg)   SpO2 94%   BMI 36.21 kg/m    Subjective:    Patient ID: Renee Parsons, female    DOB: Dec 25, 1970, 53 y.o.   MRN: [id number]  HPI: Renee Parsons is a 53 y.o. female presenting on 05/16/2024 for comprehensive medical examination. Current medical complaints include:  OBESITY Duration: chronic Previous attempts at weight loss: changed her diet, cutting portions, working out 60 minutes 2x a week Complications of obesity: ankle pain Peak weight: 225lbs Weight loss goal: 175 Weight loss to date: 8lbs Requesting obesity pharmacotherapy: no Current weight loss supplements/medications: no Previous weight loss supplements/meds: no  Menopausal Symptoms: yes  Depression Screen done today and results listed below:     05/16/2024    8:15 AM 10/27/2023    3:13 PM 05/16/2023    3:39 PM 03/04/2023    8:42 AM 01/19/2023    9:30 AM  Depression screen PHQ 2/9  Decreased Interest 0 0 0 0 0  Down, Depressed, Hopeless 0 0 0 0 0  PHQ - 2 Score 0 0 0 0 0  Altered sleeping 0 2 0 2 0  Tired, decreased energy 0 2 1 0 1  Change in appetite 0 0 0 1 0  Feeling bad or failure about yourself  0 2 0 0 0  Trouble concentrating 0 0 0 0 0  Moving slowly or fidgety/restless 0 0 0 0 0  Suicidal thoughts 0 0 0 0 0  PHQ-9 Score 0 6 1 3 1   Difficult doing work/chores   Somewhat difficult Not difficult at all Not difficult at all     Past Medical History:  Past Medical History:  Diagnosis Date   Chronic back pain     Surgical History:  Past Surgical History:  Procedure Laterality Date   COLONOSCOPY WITH PROPOFOL  N/A 12/06/2019   Procedure: COLONOSCOPY WITH BIOPSY;  Surgeon: Unk Corinn Skiff, MD;  Location: [facility];  Service: Endoscopy;  Laterality: N/A;  priority 4   POLYPECTOMY N/A 12/06/2019   Procedure:  POLYPECTOMY;  Surgeon: Unk Corinn Skiff, MD;  Location: [facility];  Service: Endoscopy;  Laterality: N/A;   TOTAL ABDOMINAL HYSTERECTOMY W/ BILATERAL SALPINGOOPHORECTOMY      Medications:  No current outpatient medications on file prior to visit.   No current facility-administered medications on file prior to visit.    Allergies:  No Known Allergies  Social History:  Social History   Socioeconomic History   Marital status: Married    Spouse name: Not on file   Number of children: Not on file   Years of education: Not on file   Highest education level: Not on file  Occupational History   Not on file  Tobacco Use   Smoking status: Former    Average packs/day: 0.3 packs/day for 20.0 years (5.0 ttl pk-yrs)    Types: Cigarettes    Start date: 08/23/1993    Passive exposure: Past   Smokeless tobacco: Never  Vaping Use   Vaping status: Never Used  Substance and Sexual Activity   Alcohol use: Yes    Alcohol/week: 4.0 standard drinks of alcohol    Types: 4 Glasses of wine per week   Drug use: No   Sexual activity: Not Currently  Birth control/protection: Surgical  Other Topics Concern   Not on file  Social History Narrative   Not on file   Social Drivers of Health   Financial Resource Strain: Low Risk  (05/16/2024)   Overall Financial Resource Strain (CARDIA)    Difficulty of Paying Living Expenses: Not hard at all  Food Insecurity: No Food Insecurity (05/16/2024)   Hunger Vital Sign    Worried About Running Out of Food in the Last Year: Never true    Ran Out of Food in the Last Year: Never true  Transportation Needs: No Transportation Needs (05/16/2024)   PRAPARE - Administrator, Civil Service (Medical): No    Lack of Transportation (Non-Medical): No  Physical Activity: Insufficiently Active (05/16/2024)   Exercise Vital Sign    Days of Exercise per Week: 2 days    Minutes of Exercise per Session: 60 min  Stress: No Stress Concern Present  (05/16/2024)   [employer] of Occupational Health - Occupational Stress Questionnaire    Feeling of Stress: Not at all  Social Connections: Moderately Integrated (05/16/2024)   Social Connection and Isolation Panel    Frequency of Communication with Friends and Family: More than three times a week    Frequency of Social Gatherings with Friends and Family: Twice a week    Attends Religious Services: More than 4 times per year    Active Member of [employer] or Organizations: Yes    Attends Engineer, structural: More than 4 times per year    Marital Status: Divorced  Intimate Partner Violence: Not At Risk (05/16/2024)   Humiliation, Afraid, Rape, and Kick questionnaire    Fear of Current or Ex-Partner: No    Emotionally Abused: No    Physically Abused: No    Sexually Abused: No   Social History   Tobacco Use  Smoking Status Former   Average packs/day: 0.3 packs/day for 20.0 years (5.0 ttl pk-yrs)   Types: Cigarettes   Start date: 08/23/1993   Passive exposure: Past  Smokeless Tobacco Never   Social History   Substance and Sexual Activity  Alcohol Use Yes   Alcohol/week: 4.0 standard drinks of alcohol   Types: 4 Glasses of wine per week    Family History:  Family History  Problem Relation Age of Onset   Migraines Mother    Cancer Father    Diabetes Maternal Grandmother    Breast cancer Neg Hx     Past medical history, surgical history, medications, allergies, family history and social history reviewed with patient today and changes made to appropriate areas of the chart.   Review of Systems  Constitutional:  Positive for diaphoresis. Negative for chills, fever, malaise/fatigue and weight loss.  HENT: Negative.    Eyes: Negative.   Respiratory: Negative.    Cardiovascular: Negative.   Gastrointestinal: Negative.   Genitourinary: Negative.   Musculoskeletal: Negative.   Skin: Negative.   Neurological: Negative.   Endo/Heme/Allergies:  Negative for  environmental allergies and polydipsia. Bruises/bleeds easily.  Psychiatric/Behavioral: Negative.     All other ROS negative except what is listed above and in the HPI.      Objective:    BP (!) 126/91 (BP Location: Left Arm, Patient Position: Sitting, Cuff Size: Normal)   Pulse 97   Temp 98 F (36.7 C) (Oral)   Ht 5' 5 (1.651 m)   Wt 217 lb 9.6 oz (98.7 kg)   SpO2 94%   BMI 36.21 kg/m   Wt Readings  from Last 3 Encounters:  05/16/24 217 lb 9.6 oz (98.7 kg)  10/27/23 214 lb 9.6 oz (97.3 kg)  05/16/23 213 lb 12.8 oz (97 kg)    Physical Exam Vitals and nursing note reviewed.  Constitutional:      General: She is not in acute distress.    Appearance: Normal appearance. She is obese. She is not ill-appearing, toxic-appearing or diaphoretic.  HENT:     Head: Normocephalic and atraumatic.     Right Ear: Tympanic membrane, ear canal and external ear normal. There is no impacted cerumen.     Left Ear: Tympanic membrane, ear canal and external ear normal. There is no impacted cerumen.     Nose: Nose normal. No congestion or rhinorrhea.     Mouth/Throat:     Mouth: Mucous membranes are moist.     Pharynx: Oropharynx is clear. No oropharyngeal exudate or posterior oropharyngeal erythema.  Eyes:     General: No scleral icterus.       Right eye: No discharge.        Left eye: No discharge.     Extraocular Movements: Extraocular movements intact.     Conjunctiva/sclera: Conjunctivae normal.     Pupils: Pupils are equal, round, and reactive to light.  Neck:     Vascular: No carotid bruit.  Cardiovascular:     Rate and Rhythm: Normal rate and regular rhythm.     Pulses: Normal pulses.     Heart sounds: No murmur heard.    No friction rub. No gallop.  Pulmonary:     Effort: Pulmonary effort is normal. No respiratory distress.     Breath sounds: Normal breath sounds. No stridor. No wheezing, rhonchi or rales.  Chest:     Chest wall: No tenderness.  Abdominal:     General: Abdomen  is flat. Bowel sounds are normal. There is no distension.     Palpations: Abdomen is soft. There is no mass.     Tenderness: There is no abdominal tenderness. There is no right CVA tenderness, left CVA tenderness, guarding or rebound.     Hernia: No hernia is present.  Genitourinary:    Comments: Breast and pelvic exams deferred with shared decision making Musculoskeletal:        General: No swelling, tenderness, deformity or signs of injury.     Cervical back: Normal range of motion and neck supple. No rigidity. No muscular tenderness.     Right lower leg: No edema.     Left lower leg: No edema.  Lymphadenopathy:     Cervical: No cervical adenopathy.  Skin:    General: Skin is warm and dry.     Capillary Refill: Capillary refill takes less than 2 seconds.     Coloration: Skin is not jaundiced or pale.     Findings: No bruising, erythema, lesion or rash.  Neurological:     General: No focal deficit present.     Mental Status: She is alert and oriented to person, place, and time. Mental status is at baseline.     Cranial Nerves: No cranial nerve deficit.     Sensory: No sensory deficit.     Motor: No weakness.     Coordination: Coordination normal.     Gait: Gait normal.     Deep Tendon Reflexes: Reflexes normal.  Psychiatric:        Mood and Affect: Mood normal.        Behavior: Behavior normal.  Thought Content: Thought content normal.        Judgment: Judgment normal.     Results for orders placed or performed in visit on 05/16/23  CBC with Differential/Platelet   Collection Time: 05/16/23  3:41 PM  Result Value Ref Range   WBC 9.1 3.4 - 10.8 x10E3/uL   RBC 4.36 3.77 - 5.28 x10E6/uL   Hemoglobin 12.2 11.1 - 15.9 g/dL   Hematocrit 62.0 65.9 - 46.6 %   MCV 87 79 - 97 fL   MCH 28.0 26.6 - 33.0 pg   MCHC 32.2 31.5 - 35.7 g/dL   RDW 87.0 88.2 - 84.5 %   Platelets 400 150 - 450 x10E3/uL   Neutrophils 60 Not Estab. %   Lymphs 30 Not Estab. %   Monocytes 7 Not Estab.  %   Eos 2 Not Estab. %   Basos 1 Not Estab. %   Neutrophils Absolute 5.5 1.4 - 7.0 x10E3/uL   Lymphocytes Absolute 2.7 0.7 - 3.1 x10E3/uL   Monocytes Absolute 0.6 0.1 - 0.9 x10E3/uL   EOS (ABSOLUTE) 0.2 0.0 - 0.4 x10E3/uL   Basophils Absolute 0.1 0.0 - 0.2 x10E3/uL   Immature Granulocytes 0 Not Estab. %   Immature Grans (Abs) 0.0 0.0 - 0.1 x10E3/uL  Comprehensive metabolic panel   Collection Time: 05/16/23  3:41 PM  Result Value Ref Range   Glucose 108 (H) 70 - 99 mg/dL   BUN 11 6 - 24 mg/dL   Creatinine, Ser 9.17 0.57 - 1.00 mg/dL   eGFR 85 >40 fO/fpw/8.26   BUN/Creatinine Ratio 13 9 - 23   Sodium 142 134 - 144 mmol/L   Potassium 3.9 3.5 - 5.2 mmol/L   Chloride 107 (H) 96 - 106 mmol/L   CO2 20 20 - 29 mmol/L   Calcium 9.2 8.7 - 10.2 mg/dL   Total Protein 7.4 6.0 - 8.5 g/dL   Albumin 4.2 3.8 - 4.9 g/dL   Globulin, Total 3.2 1.5 - 4.5 g/dL   Bilirubin Total <9.7 0.0 - 1.2 mg/dL   Alkaline Phosphatase 166 (H) 44 - 121 IU/L   AST 33 0 - 40 IU/L   ALT 35 (H) 0 - 32 IU/L  Lipid Panel w/o Chol/HDL Ratio   Collection Time: 05/16/23  3:41 PM  Result Value Ref Range   Cholesterol, Total 224 (H) 100 - 199 mg/dL   Triglycerides 793 (H) 0 - 149 mg/dL   HDL 58 >60 mg/dL   VLDL Cholesterol Cal 36 5 - 40 mg/dL   LDL Chol Calc (NIH) 869 (H) 0 - 99 mg/dL  TSH   Collection Time: 05/16/23  3:41 PM  Result Value Ref Range   TSH 2.140 0.450 - 4.500 uIU/mL      Assessment & Plan:   Problem List Items Addressed This Visit       Respiratory   Allergic rhinitis   Relevant Medications   loratadine  (CLARITIN ) 10 MG tablet   montelukast  (SINGULAIR ) 10 MG tablet   Other Visit Diagnoses       Routine general medical examination at a health care facility    -  Primary   Vaccines up  to date/declined. Screening labs checked today. Pap N/A. Mammo ordered today. Colonoscopy up today. Continue diet and exercise.   Relevant Orders   CBC with Differential/Platelet   Comprehensive metabolic  panel with GFR   Lipid Panel w/o Chol/HDL Ratio   TSH   Bayer DCA Hb A1c Waived   Hepatitis B surface antibody,quantitative  Encounter for screening mammogram for malignant neoplasm of breast       Mammogram ordered today.   Relevant Orders   MM 3D SCREENING MAMMOGRAM BILATERAL BREAST     Acute bilateral low back pain without sciatica       Relevant Medications   diclofenac  (VOLTAREN ) 75 MG EC tablet   methocarbamol  (ROBAXIN -750) 750 MG tablet        Follow up plan: Return in about 1 year (around 05/16/2025) for physical.   LABORATORY TESTING:  - Pap smear: not applicable  IMMUNIZATIONS:   - Tdap: Tetanus vaccination status reviewed: last tetanus booster within 10 years. - Influenza: Refused - Pneumovax: Refused - Prevnar: Refused - COVID: Refused - HPV: Not applicable - Shingrix  vaccine: Given elsewhere  SCREENING: -Mammogram: Ordered today  - Colonoscopy: Up to date   PATIENT COUNSELING:   Advised to take 1 mg of folate supplement per day if capable of pregnancy.   Sexuality: Discussed sexually transmitted diseases, partner selection, use of condoms, avoidance of unintended pregnancy  and contraceptive alternatives.   Advised to avoid cigarette smoking.  I discussed with the patient that most people either abstain from alcohol or drink within safe limits (<=14/week and <=4 drinks/occasion for males, <=7/weeks and <= 3 drinks/occasion for females) and that the risk for alcohol disorders and other health effects rises proportionally with the number of drinks per week and how often a drinker exceeds daily limits.  Discussed cessation/primary prevention of drug use and availability of treatment for abuse.   Diet: Encouraged to adjust caloric intake to maintain  or achieve ideal body weight, to reduce intake of dietary saturated fat and total fat, to limit sodium intake by avoiding high sodium foods and not adding table salt, and to maintain adequate dietary potassium  and calcium preferably from fresh fruits, vegetables, and low-fat dairy products.    stressed the importance of regular exercise  Injury prevention: Discussed safety belts, safety helmets, smoke detector, smoking near bedding or upholstery.   Dental health: Discussed importance of regular tooth brushing, flossing, and dental visits.    NEXT PREVENTATIVE PHYSICAL DUE IN 1 YEAR. Return in about 1 year (around 05/16/2025) for physical.

## 2024-05-17 ENCOUNTER — Ambulatory Visit: Payer: Self-pay | Admitting: Family Medicine

## 2024-05-17 LAB — COMPREHENSIVE METABOLIC PANEL WITH GFR
ALT: 32 IU/L (ref 0–32)
AST: 24 IU/L (ref 0–40)
Albumin: 4.5 g/dL (ref 3.8–4.9)
Alkaline Phosphatase: 172 IU/L — ABNORMAL HIGH (ref 49–135)
BUN/Creatinine Ratio: 15 (ref 9–23)
BUN: 13 mg/dL (ref 6–24)
Bilirubin Total: 0.3 mg/dL (ref 0.0–1.2)
CO2: 19 mmol/L — ABNORMAL LOW (ref 20–29)
Calcium: 9.6 mg/dL (ref 8.7–10.2)
Chloride: 104 mmol/L (ref 96–106)
Creatinine, Ser: 0.85 mg/dL (ref 0.57–1.00)
Globulin, Total: 3.7 g/dL (ref 1.5–4.5)
Glucose: 95 mg/dL (ref 70–99)
Potassium: 4.4 mmol/L (ref 3.5–5.2)
Sodium: 139 mmol/L (ref 134–144)
Total Protein: 8.2 g/dL (ref 6.0–8.5)
eGFR: 81 mL/min/1.73 (ref >59)

## 2024-05-17 LAB — CBC WITH DIFFERENTIAL/PLATELET
Basophils Absolute: 0.1 x10E3/uL (ref 0.0–0.2)
Basos: 1 %
EOS (ABSOLUTE): 0.1 x10E3/uL (ref 0.0–0.4)
Eos: 2 %
Hematocrit: 44.1 % (ref 34.0–46.6)
Hemoglobin: 14.1 g/dL (ref 11.1–15.9)
Immature Grans (Abs): 0 x10E3/uL (ref 0.0–0.1)
Immature Granulocytes: 0 %
Lymphocytes Absolute: 2.4 x10E3/uL (ref 0.7–3.1)
Lymphs: 38 %
MCH: 28 pg (ref 26.6–33.0)
MCHC: 32 g/dL (ref 31.5–35.7)
MCV: 88 fL (ref 79–97)
Monocytes Absolute: 0.5 x10E3/uL (ref 0.1–0.9)
Monocytes: 8 %
Neutrophils Absolute: 3.1 x10E3/uL (ref 1.4–7.0)
Neutrophils: 51 %
Platelets: 426 x10E3/uL (ref 150–450)
RBC: 5.04 x10E6/uL (ref 3.77–5.28)
RDW: 13.2 % (ref 11.7–15.4)
WBC: 6.2 x10E3/uL (ref 3.4–10.8)

## 2024-05-17 LAB — HEPATITIS B SURFACE ANTIBODY, QUANTITATIVE: Hepatitis B Surf Ab Quant: <3.5 m[IU]/mL — ABNORMAL LOW

## 2024-05-17 LAB — LIPID PANEL W/O CHOL/HDL RATIO
Cholesterol, Total: 271 mg/dL — ABNORMAL HIGH (ref 100–199)
HDL: 58 mg/dL (ref >39)
LDL Chol Calc (NIH): 192 mg/dL — ABNORMAL HIGH (ref 0–99)
Triglycerides: 120 mg/dL (ref 0–149)
VLDL Cholesterol Cal: 21 mg/dL (ref 5–40)

## 2024-05-17 LAB — TSH: TSH: 1.91 u[IU]/mL (ref 0.450–4.500)

## 2024-11-15 ENCOUNTER — Ambulatory Visit: Admitting: Family Medicine
